# Patient Record
Sex: Female | Born: 1965 | Race: White | Hispanic: No | Smoking: Never smoker
Health system: Southern US, Community
[De-identification: ages and names within clinical notes are randomized; demographics above are authoritative.]

## PROBLEM LIST (undated history)

## (undated) DIAGNOSIS — R51 Headache: Secondary | ICD-10-CM

## (undated) DIAGNOSIS — T7840XA Allergy, unspecified, initial encounter: Secondary | ICD-10-CM

## (undated) DIAGNOSIS — I499 Cardiac arrhythmia, unspecified: Secondary | ICD-10-CM

## (undated) DIAGNOSIS — F419 Anxiety disorder, unspecified: Secondary | ICD-10-CM

## (undated) DIAGNOSIS — R519 Headache, unspecified: Secondary | ICD-10-CM

## (undated) DIAGNOSIS — N893 Dysplasia of vagina, unspecified: Secondary | ICD-10-CM

## (undated) DIAGNOSIS — I10 Essential (primary) hypertension: Secondary | ICD-10-CM

## (undated) DIAGNOSIS — K219 Gastro-esophageal reflux disease without esophagitis: Secondary | ICD-10-CM

## (undated) HISTORY — DX: Essential (primary) hypertension: I10

## (undated) HISTORY — DX: Gastro-esophageal reflux disease without esophagitis: K21.9

## (undated) HISTORY — DX: Anxiety disorder, unspecified: F41.9

## (undated) HISTORY — DX: Allergy, unspecified, initial encounter: T78.40XA

---

## 2000-08-28 ENCOUNTER — Emergency Department (HOSPITAL_COMMUNITY): Admission: EM | Admit: 2000-08-28 | Discharge: 2000-08-28 | Payer: Self-pay | Admitting: Emergency Medicine

## 2001-01-17 HISTORY — PX: CERVICAL BIOPSY  W/ LOOP ELECTRODE EXCISION: SUR135

## 2001-02-27 ENCOUNTER — Other Ambulatory Visit: Admission: RE | Admit: 2001-02-27 | Discharge: 2001-02-27 | Payer: Self-pay | Admitting: Obstetrics and Gynecology

## 2001-05-15 ENCOUNTER — Emergency Department (HOSPITAL_COMMUNITY): Admission: EM | Admit: 2001-05-15 | Discharge: 2001-05-16 | Payer: Self-pay | Admitting: Internal Medicine

## 2001-05-16 ENCOUNTER — Encounter: Payer: Self-pay | Admitting: Internal Medicine

## 2005-03-09 ENCOUNTER — Emergency Department: Payer: Self-pay | Admitting: Emergency Medicine

## 2005-03-09 ENCOUNTER — Other Ambulatory Visit: Payer: Self-pay

## 2005-03-10 ENCOUNTER — Ambulatory Visit: Payer: Self-pay | Admitting: Emergency Medicine

## 2007-06-18 ENCOUNTER — Emergency Department: Payer: Self-pay | Admitting: Emergency Medicine

## 2007-11-06 ENCOUNTER — Encounter: Admission: RE | Admit: 2007-11-06 | Discharge: 2007-11-06 | Payer: Self-pay | Admitting: Internal Medicine

## 2008-11-06 ENCOUNTER — Encounter: Admission: RE | Admit: 2008-11-06 | Discharge: 2008-11-06 | Payer: Self-pay | Admitting: General Practice

## 2014-01-17 HISTORY — PX: OTHER SURGICAL HISTORY: SHX169

## 2015-03-31 ENCOUNTER — Encounter: Payer: Self-pay | Admitting: Pediatrics

## 2015-03-31 ENCOUNTER — Ambulatory Visit (INDEPENDENT_AMBULATORY_CARE_PROVIDER_SITE_OTHER): Payer: BLUE CROSS/BLUE SHIELD | Admitting: Pediatrics

## 2015-03-31 VITALS — BP 122/80 | HR 70 | Temp 98.2°F | Resp 16 | Ht 64.37 in | Wt 170.4 lb

## 2015-03-31 DIAGNOSIS — K219 Gastro-esophageal reflux disease without esophagitis: Secondary | ICD-10-CM

## 2015-03-31 DIAGNOSIS — R062 Wheezing: Secondary | ICD-10-CM

## 2015-03-31 DIAGNOSIS — J301 Allergic rhinitis due to pollen: Secondary | ICD-10-CM | POA: Diagnosis not present

## 2015-03-31 MED ORDER — FLUTICASONE PROPIONATE 50 MCG/ACT NA SUSP: 2.0000 | Freq: Every day | NASAL | Status: DC

## 2015-03-31 NOTE — Progress Notes (Signed)
[place] [address] Dept: [phone]  New Patient Note  Patient ID: Joanne Ibarra, female    DOB: 1965/07/27  Age: 50 y.o. MRN: [id number] Date of Office Visit: 03/31/2015 Referring provider: Guadalupe Maple, MD No address on file    Chief Complaint: Allergies and Cough  HPI Joanne Ibarra presents for evaluation of nasal congestion since childhood. She has had a runny nose is stuffy nose and itchy eyes and itchy throat. Her symptoms are worse in the spring and fall of the year. She had the flu bronchitis 2 weeks ago and has had a cough since then. If she is exposed to cats and dogs , she has a stuffy nose.   Review of Systems  Constitutional: Negative.   HENT:       Nasal congestion since childhood  Eyes:       Itchy eyes at times  Respiratory:       Cough for 2 weeks following the flu  Cardiovascular:       Occasional palpitations. High blood pressure.  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   Neurological:       History of migraine headaches  Endo/Heme/Allergies:       Spring and fall allergies  Psychiatric/Behavioral: Negative.     Outpatient Encounter Prescriptions as of 03/31/2015  Medication Sig  . Calcium Carb-Cholecalciferol (CALTRATE 600+D3 SOFT PO) Take by mouth.  . hydrochlorothiazide (HYDRODIURIL) 25 MG tablet Take 25 mg by mouth daily.  . Multiple Vitamins-Minerals (CENTRUM PO) Take by mouth.  . Omeprazole 20 MG TBEC Take by mouth.  . fluticasone (FLONASE) 50 MCG/ACT nasal spray Place 2 sprays into both nostrils daily.  Marland Kitchen oseltamivir (TAMIFLU) 75 MG capsule Reported on 03/31/2015   No facility-administered encounter medications on file as of 03/31/2015.     Drug Allergies:  No Known Allergies  Family History: Joanne Ibarra's family history includes Asthma in her daughter and sister; Food Allergy in her sister.  There is no history of Allergic rhinitis, Angioedema, Eczema, Immunodeficiency, or Urticaria..  Social and environmental. There are no pets in the home. She is not exposed to cigarette smoking. She has not smoked cigarettes in the past. Her symptoms are not worse at work.  Physical Exam: BP 122/80 mmHg  Pulse 70  Temp(Src) 98.2 F (36.8 C) (Oral)  Resp 16  Ht 5' 4.37" (1.635 m)  Wt 170 lb 6.7 oz (77.3 kg)  BMI 28.92 kg/m2   Physical Exam  Constitutional: She is oriented to person, place, and time. She appears well-developed and well-nourished.  HENT:  Eyes normal. Ears normal. Nose moderate swelling of nasal turbinates with clear nasal discharge. Pharynx normal.  Neck: Neck supple. No thyromegaly present.  Cardiovascular:  S1 and S2 normal no murmurs  Pulmonary/Chest:  Clear to percussion and auscultation  Abdominal: Soft. There is no tenderness (no hepatosplenomegaly).  Lymphadenopathy:    She has no cervical adenopathy.  Neurological: She is alert and oriented to person, place, and time.  Skin:  Clear  Psychiatric: She has a normal mood and affect. Her behavior is normal. Judgment and thought content normal.  Vitals reviewed.   Diagnostics: FVC 3.20 L FEV1 2.66 L. Predicted FVC 3.56 L predicted FEV1 2.82 L. After albuterol 2 puffs FVC 3.36 L FEV1 2.79 L-the spirometry is  in the normal range and there was no significant improvement after albuterol  Allergy skin testing was very positive to grass pollen, weeds pollen, tree pollens, dust mite, cat, dog, cockroach, Slight reactivity noted to molds   Assessment Assessment and Plan: 1. Allergic rhinitis due to pollen   2. Wheezing   3. Gastroesophageal reflux disease without esophagitis     Meds ordered this encounter  Medications  . fluticasone (FLONASE) 50 MCG/ACT nasal spray    Sig: Place 2 sprays into both nostrils daily.    Dispense:  16 g    Refill:  5    For stuffy nose or drainage. Please keep rx on file pt.  Will call when needed    Patient Instructions  Environmental control of dust mite Zyrtec 10 mg once a day for runny nose Fluticasone 2 sprays per nostril once a day if needed for stuffy nose Opcon-A one drop 3 times a day if needed for itchy eyes Add  prednisone 10 mg twice a day for 4 days 10 mg on day 5 to bring your allergic symptoms under control Call me if you are not doing better on this treatment plan    Return in about 3 months (around 07/01/2015).   Thank you for the opportunity to care for this patient.  Please do not hesitate to contact me with questions.  Penne Lash, M.D.  [facility], [place] [address] [place], [place] [address] [phone]

## 2015-03-31 NOTE — Patient Instructions (Signed)
Environmental control of dust mite Zyrtec 10 mg once a day for runny nose Fluticasone 2 sprays per nostril once a day if needed for stuffy nose Opcon-A one drop 3 times a day if needed for itchy eyes Add  prednisone 10 mg twice a day for 4 days 10 mg on day 5 to bring your allergic symptoms under control Call me if you are not doing better on this treatment plan

## 2015-06-12 ENCOUNTER — Other Ambulatory Visit: Payer: Self-pay

## 2015-06-12 ENCOUNTER — Encounter: Payer: Self-pay | Admitting: *Deleted

## 2015-06-12 NOTE — Patient Instructions (Signed)
  Your procedure is scheduled on: 06-19-15 (FRIDAY) Report to [facility] To find out your arrival time please call [phone] between 1PM - 3PM on 06-18-15 (THURSDAY)  Remember: Instructions that are not followed completely may result in serious medical risk, up to and including death, or upon the discretion of your surgeon and anesthesiologist your surgery may need to be rescheduled.    _X___ 1. Do not eat food or drink liquids after midnight. No gum chewing or hard candies.     _X___ 2. No Alcohol for 24 hours before or after surgery.   ____ 3. Bring all medications with you on the day of surgery if instructed.    _X___ 4. Notify your doctor if there is any change in your medical condition     (cold, fever, infections).     Do not wear jewelry, make-up, hairpins, clips or nail polish.  Do not wear lotions, powders, or perfumes. You may wear deodorant.  Do not shave 48 hours prior to surgery. Men may shave face and neck.  Do not bring valuables to the hospital.    [facility] is not responsible for any belongings or valuables.               Contacts, dentures or bridgework may not be worn into surgery.  Leave your suitcase in the car. After surgery it may be brought to your room.  For patients admitted to the hospital, discharge time is determined by your treatment team.   Patients discharged the day of surgery will not be allowed to drive home.   Please read over the following fact sheets that you were given:    _X___ Take these medicines the morning of surgery with A SIP OF WATER:    1. OMEPRAZOLE (PRILOSEC)  2. TAKE AN EXTRA PRILOSEC ON Thursday NIGHT BEFORE BED  3.   4.  5.  6.  ____ Fleet Enema (as directed)   ____ Use CHG Soap as directed  ____ Use inhalers on the day of surgery  ____ Stop metformin 2 days prior to surgery    ____ Take 1/2 of usual insulin dose the night before surgery and none on the morning of surgery.   ____ Stop  Coumadin/Plavix/aspirin-N/A  _X___ Stop Anti-inflammatories-NO NSAIDS OR ASPIRIN PORDUCTS-TYLENOL OK TO TAKE   ____ Stop supplements until after surgery.    ____ Bring C-Pap to the hospital.

## 2015-06-17 ENCOUNTER — Encounter
Admission: RE | Admit: 2015-06-17 | Discharge: 2015-06-17 | Disposition: A | Discharge status: Home / Self Care | Payer: BLUE CROSS/BLUE SHIELD | Source: Ambulatory Visit | Attending: Obstetrics & Gynecology | Admitting: Obstetrics & Gynecology

## 2015-06-17 DIAGNOSIS — N870 Mild cervical dysplasia: Secondary | ICD-10-CM | POA: Diagnosis not present

## 2015-06-17 DIAGNOSIS — K219 Gastro-esophageal reflux disease without esophagitis: Secondary | ICD-10-CM | POA: Diagnosis not present

## 2015-06-17 DIAGNOSIS — G43909 Migraine, unspecified, not intractable, without status migrainosus: Secondary | ICD-10-CM | POA: Diagnosis not present

## 2015-06-17 DIAGNOSIS — Z808 Family history of malignant neoplasm of other organs or systems: Secondary | ICD-10-CM | POA: Diagnosis not present

## 2015-06-17 DIAGNOSIS — I10 Essential (primary) hypertension: Secondary | ICD-10-CM | POA: Diagnosis not present

## 2015-06-17 DIAGNOSIS — N950 Postmenopausal bleeding: Secondary | ICD-10-CM | POA: Diagnosis present

## 2015-06-17 DIAGNOSIS — Z803 Family history of malignant neoplasm of breast: Secondary | ICD-10-CM | POA: Diagnosis not present

## 2015-06-17 DIAGNOSIS — Z79899 Other long term (current) drug therapy: Secondary | ICD-10-CM | POA: Diagnosis not present

## 2015-06-17 LAB — ABO/RH: ABO/RH(D): O POS

## 2015-06-17 LAB — POTASSIUM: POTASSIUM: 3.8 mmol/L (ref 3.5–5.1)

## 2015-06-17 LAB — CBC
HCT: 41.1 % (ref 35.0–47.0)
HEMOGLOBIN: 14 g/dL (ref 12.0–16.0)
MCH: 29.8 pg (ref 26.0–34.0)
MCHC: 34 g/dL (ref 32.0–36.0)
MCV: 87.7 fL (ref 80.0–100.0)
Platelets: 250 10*3/uL (ref 150–440)
RBC: 4.68 MIL/uL (ref 3.80–5.20)
RDW: 14.3 % (ref 11.5–14.5)
WBC: 7 10*3/uL (ref 3.6–11.0)

## 2015-06-17 LAB — TYPE AND SCREEN
ABO/RH(D): O POS
ANTIBODY SCREEN: NEGATIVE

## 2015-06-19 ENCOUNTER — Ambulatory Visit: Payer: BLUE CROSS/BLUE SHIELD | Admitting: Anesthesiology

## 2015-06-19 ENCOUNTER — Encounter
Admission: RE | Disposition: A | Discharge status: Home / Self Care | Payer: Self-pay | Source: Ambulatory Visit | Attending: Obstetrics & Gynecology

## 2015-06-19 ENCOUNTER — Ambulatory Visit
Admission: RE | Admit: 2015-06-19 | Discharge: 2015-06-19 | Disposition: A | Discharge status: Home / Self Care | Payer: BLUE CROSS/BLUE SHIELD | Source: Ambulatory Visit | Attending: Obstetrics & Gynecology | Admitting: Obstetrics & Gynecology

## 2015-06-19 ENCOUNTER — Encounter: Payer: Self-pay | Admitting: *Deleted

## 2015-06-19 DIAGNOSIS — N950 Postmenopausal bleeding: Secondary | ICD-10-CM | POA: Insufficient documentation

## 2015-06-19 DIAGNOSIS — Z803 Family history of malignant neoplasm of breast: Secondary | ICD-10-CM | POA: Insufficient documentation

## 2015-06-19 DIAGNOSIS — N870 Mild cervical dysplasia: Secondary | ICD-10-CM | POA: Diagnosis not present

## 2015-06-19 DIAGNOSIS — Z79899 Other long term (current) drug therapy: Secondary | ICD-10-CM | POA: Insufficient documentation

## 2015-06-19 DIAGNOSIS — G43909 Migraine, unspecified, not intractable, without status migrainosus: Secondary | ICD-10-CM | POA: Insufficient documentation

## 2015-06-19 DIAGNOSIS — K219 Gastro-esophageal reflux disease without esophagitis: Secondary | ICD-10-CM | POA: Insufficient documentation

## 2015-06-19 DIAGNOSIS — Z808 Family history of malignant neoplasm of other organs or systems: Secondary | ICD-10-CM | POA: Insufficient documentation

## 2015-06-19 DIAGNOSIS — I10 Essential (primary) hypertension: Secondary | ICD-10-CM | POA: Insufficient documentation

## 2015-06-19 DIAGNOSIS — N879 Dysplasia of cervix uteri, unspecified: Secondary | ICD-10-CM | POA: Diagnosis present

## 2015-06-19 HISTORY — DX: Cardiac arrhythmia, unspecified: I49.9

## 2015-06-19 HISTORY — DX: Headache, unspecified: R51.9

## 2015-06-19 HISTORY — DX: Headache: R51

## 2015-06-19 HISTORY — PX: DILATATION & CURETTAGE/HYSTEROSCOPY WITH MYOSURE: SHX6511

## 2015-06-19 HISTORY — PX: LEEP: SHX91

## 2015-06-19 SURGERY — DILATATION & CURETTAGE/HYSTEROSCOPY WITH MYOSURE
Anesthesia: General | Wound class: Clean Contaminated

## 2015-06-19 MED ORDER — LACTATED RINGERS IV SOLN
INTRAVENOUS | Status: DC
  Administered 2015-06-19: 14:00:00 via INTRAVENOUS

## 2015-06-19 MED ORDER — FENTANYL CITRATE (PF) 100 MCG/2ML IJ SOLN
25.0000 ug | INTRAMUSCULAR | Status: DC | PRN
  Administered 2015-06-19: 50 ug via INTRAVENOUS

## 2015-06-19 MED ORDER — ACETAMINOPHEN 650 MG RE SUPP: 650.0000 mg | RECTAL | Status: DC | PRN

## 2015-06-19 MED ORDER — OXYCODONE-ACETAMINOPHEN 5-325 MG PO TABS
1.0000 | ORAL_TABLET | ORAL | Status: DC | PRN
  Administered 2015-06-19: 1 via ORAL

## 2015-06-19 MED ORDER — LIDOCAINE-EPINEPHRINE 1 %-1:100000 IJ SOLN
INTRAMUSCULAR | Status: AC
  Filled 2015-06-19: qty 1

## 2015-06-19 MED ORDER — ONDANSETRON HCL 4 MG/2ML IJ SOLN
INTRAMUSCULAR | Status: DC | PRN
  Administered 2015-06-19: 4 mg via INTRAVENOUS

## 2015-06-19 MED ORDER — SILVER NITRATE-POT NITRATE 75-25 % EX MISC
CUTANEOUS | Status: AC
  Filled 2015-06-19: qty 4

## 2015-06-19 MED ORDER — IODINE STRONG (LUGOLS) 5 % PO SOLN
ORAL | Status: AC
  Filled 2015-06-19: qty 1

## 2015-06-19 MED ORDER — FENTANYL CITRATE (PF) 100 MCG/2ML IJ SOLN
INTRAMUSCULAR | Status: AC
  Administered 2015-06-19: 50 ug via INTRAVENOUS
  Filled 2015-06-19: qty 2

## 2015-06-19 MED ORDER — MORPHINE SULFATE (PF) 2 MG/ML IV SOLN
1.0000 mg | INTRAVENOUS | Status: DC | PRN
Start: 2015-06-19 — End: 2015-06-19

## 2015-06-19 MED ORDER — OXYCODONE-ACETAMINOPHEN 5-325 MG PO TABS
ORAL_TABLET | ORAL | Status: DC
Start: 2015-06-19 — End: 2015-06-19
  Filled 2015-06-19: qty 1

## 2015-06-19 MED ORDER — ONDANSETRON HCL 4 MG/2ML IJ SOLN: 4.0000 mg | Freq: Once | INTRAMUSCULAR | Status: DC | PRN

## 2015-06-19 MED ORDER — ACETAMINOPHEN 325 MG PO TABS: 650.0000 mg | ORAL_TABLET | ORAL | Status: DC | PRN

## 2015-06-19 MED ORDER — PROPOFOL 10 MG/ML IV BOLUS
INTRAVENOUS | Status: DC | PRN
  Administered 2015-06-19: 150 mg via INTRAVENOUS

## 2015-06-19 MED ORDER — KETOROLAC TROMETHAMINE 30 MG/ML IJ SOLN: 30.0000 mg | Freq: Four times a day (QID) | INTRAMUSCULAR | Status: DC

## 2015-06-19 MED ORDER — DEXAMETHASONE SODIUM PHOSPHATE 10 MG/ML IJ SOLN
INTRAMUSCULAR | Status: DC | PRN
  Administered 2015-06-19: 5 mg via INTRAVENOUS

## 2015-06-19 MED ORDER — MIDAZOLAM HCL 2 MG/2ML IJ SOLN
INTRAMUSCULAR | Status: DC | PRN
  Administered 2015-06-19: 2 mg via INTRAVENOUS

## 2015-06-19 MED ORDER — KETOROLAC TROMETHAMINE 60 MG/2ML IM SOLN
INTRAMUSCULAR | Status: AC
  Administered 2015-06-19: 30 mg
  Filled 2015-06-19: qty 2

## 2015-06-19 MED ORDER — LIDOCAINE HCL (CARDIAC) 20 MG/ML IV SOLN
INTRAVENOUS | Status: DC | PRN
  Administered 2015-06-19: 100 mg via INTRAVENOUS

## 2015-06-19 MED ORDER — OXYCODONE-ACETAMINOPHEN 5-325 MG PO TABS: 1.0000 | ORAL_TABLET | ORAL | Status: DC | PRN

## 2015-06-19 MED ORDER — FERRIC SUBSULFATE 259 MG/GM EX SOLN
CUTANEOUS | Status: AC
  Filled 2015-06-19: qty 8

## 2015-06-19 MED ORDER — KETOROLAC TROMETHAMINE 60 MG/2ML IM SOLN
INTRAMUSCULAR | Status: AC
  Filled 2015-06-19: qty 2

## 2015-06-19 MED ORDER — FENTANYL CITRATE (PF) 100 MCG/2ML IJ SOLN
INTRAMUSCULAR | Status: DC | PRN
  Administered 2015-06-19 (×2): 50 ug via INTRAVENOUS

## 2015-06-19 SURGICAL SUPPLY — 44 items
ABLATOR ENDOMETRIAL MYOSURE (ABLATOR) ×3 IMPLANT
APPLICATOR COTTON TIP 6IN STRL (MISCELLANEOUS) ×3 IMPLANT
BAG COUNTER SPONGE EZ (MISCELLANEOUS) ×2 IMPLANT
BAG SPNG 4X4 CLR HAZ (MISCELLANEOUS) ×1
CANISTER SUC SOCK COL 7IN (MISCELLANEOUS) ×3 IMPLANT
CANISTER SUCT 1200ML W/VALVE (MISCELLANEOUS) ×3 IMPLANT
CATH ROBINSON RED A/P 16FR (CATHETERS) ×3 IMPLANT
CNTNR SPEC 2.5X3XGRAD LEK (MISCELLANEOUS) ×2
CONT SPEC 4OZ STER OR WHT (MISCELLANEOUS) ×4
CONT SPEC 4OZ STRL OR WHT (MISCELLANEOUS) ×2
CONTAINER SPEC 2.5X3XGRAD LEK (MISCELLANEOUS) ×2 IMPLANT
COUNTER SPONGE BAG EZ (MISCELLANEOUS) ×1
ELECT LEEP BALL 5MM 12CM (MISCELLANEOUS) ×3
ELECT LEEP LOOP 20X10 R2010 (MISCELLANEOUS) ×3
ELECT LOOP 1.0X1.0CM R1010 (MISCELLANEOUS) ×3
ELECT REM PT RETURN 9FT ADLT (ELECTROSURGICAL) ×3
ELECTRODE LEEP BALL 5MM 12CM (MISCELLANEOUS) ×1 IMPLANT
ELECTRODE LEP LOOP 20X10 R2010 (MISCELLANEOUS) ×1 IMPLANT
ELECTRODE LOOP 1.0X1.0CM R1010 (MISCELLANEOUS) ×1 IMPLANT
ELECTRODE REM PT RTRN 9FT ADLT (ELECTROSURGICAL) ×1 IMPLANT
GLOVE BIO SURGEON STRL SZ8 (GLOVE) ×3 IMPLANT
GOWN STRL REUS W/ TWL LRG LVL3 (GOWN DISPOSABLE) ×1 IMPLANT
GOWN STRL REUS W/ TWL XL LVL3 (GOWN DISPOSABLE) ×1 IMPLANT
GOWN STRL REUS W/TWL LRG LVL3 (GOWN DISPOSABLE) ×3
GOWN STRL REUS W/TWL XL LVL3 (GOWN DISPOSABLE) ×3
HANDLE YANKAUER SUCT BULB TIP (MISCELLANEOUS) ×2 IMPLANT
MYOSURE LITE POLYP REMOVAL (MISCELLANEOUS) ×3 IMPLANT
NDL SPNL 22GX3.5 QUINCKE BK (NEEDLE) ×1 IMPLANT
NEEDLE SPNL 22GX3.5 QUINCKE BK (NEEDLE) ×3 IMPLANT
NS IRRIG 500ML POUR BTL (IV SOLUTION) ×3 IMPLANT
PACK DNC HYST (MISCELLANEOUS) ×3 IMPLANT
PAD OB MATERNITY 4.3X12.25 (PERSONAL CARE ITEMS) ×3 IMPLANT
PAD PREP 24X41 OB/GYN DISP (PERSONAL CARE ITEMS) ×3 IMPLANT
PENCIL ELECTRO HAND CTR (MISCELLANEOUS) ×3 IMPLANT
SOL .9 NS 3000ML IRR  AL (IV SOLUTION) ×2
SOL .9 NS 3000ML IRR AL (IV SOLUTION) ×1
SOL .9 NS 3000ML IRR UROMATIC (IV SOLUTION) ×1 IMPLANT
STRAP SAFETY BODY (MISCELLANEOUS) ×3 IMPLANT
STRAW SMOKE EVAC LEEP 6150 NON (MISCELLANEOUS) ×3 IMPLANT
SYR CONTROL 10ML (SYRINGE) ×3 IMPLANT
TOWEL OR 17X26 4PK STRL BLUE (TOWEL DISPOSABLE) ×3 IMPLANT
TUBING CONNECTING 10 (TUBING) ×2 IMPLANT
TUBING CONNECTING 10' (TUBING) ×1
TUBING HYSTEROSCOPY DOLPHIN (MISCELLANEOUS) ×3 IMPLANT

## 2015-06-19 NOTE — H&P (Signed)
History and Physical Interval Note:  06/19/2015 2:16 PM  Joanne Ibarra  has presented today for surgery, with the diagnosis of Frederica  The various methods of treatment have been discussed with the patient and family. After consideration of risks, benefits and other options for treatment, the patient has consented to  Procedure(s): DILATATION & CURETTAGE/HYSTEROSCOPY WITH MYOSURE (N/A) LOOP ELECTROSURGICAL EXCISION PROCEDURE (LEEP) (N/A) as a surgical intervention .  The patient's history has been reviewed, patient examined, no change in status, stable for surgery.  Pt has the following beta blocker history-  Not taking Beta Blocker.  I have reviewed the patient's chart and labs.  Questions were answered to the patient's satisfaction.    Hoyt Koch

## 2015-06-19 NOTE — Op Note (Signed)
Operative Note   SURGERY DATE: 06/19/2015  PRE-OP DIAGNOSIS: Cervical Dysplasia and Postmenopausal Bleeding  POST-OP DIAGNOSIS: same  PROCEDURE: Exam under anesthesia, loop electricosurgical excisional procedure, endocervical curettage, Hysteroscopy dilation and curettage  SURGEON: Barnett Applebaum, MD, FACOG  ANESTHESIA: Choice   ESTIMATED BLOOD LOSS: Minimal   SPECIMENS: @ORSPECIMEN @   COMPLICATIONS: None  INDICATIONS: PMB, CIN I recurrent  FINDINGS: EGBUS and vagina within normal limits. Cervix grossly normal appearing.  No polyps in uterus.    PROCEDURE IN DETAIL: After informed consent was obtained, the patient was taken to the operating room where general anesthesia was administered without difficulty. The patient was positioned in the dorsal lithotomy position in [place]. Time-out was performed. The patient was examined under anesthesia, and the above findings were noted. She was prepped and draped in normal sterile fashion. The patient's bladder was cathaterized with an in and out catheter.  The weightedspeculum was placed inside the patient's vagina, and the the anterior lip of the cervix was seen and grasped with the tenaculum.  An Endocervical specimen was obtained with a kevorkian curette. The uterine cavity was sounded to 7cm, and then the cervix was progressively dilated to a 16French-Pratt dilator. The 30 degree hysteroscope was introduced, with saline fluid used to distend the intrauterine cavity, with the above noted findings.  The hystersocope was removed and the uterine cavity was curetted until a gritty texture was noted, yielding endometrial curettings. Excellent hemostasis was noted, and all instruments were removed, with excellent hemostasis noted throughout. Minimal discrepancy in fluid was noted.  Using the LEEP device and loops for ecto- and endo- cervical specimens,  the conization was performed, making sure to encompass the entire lesion and transformation  zone and to take a deeper second specimen. The cone bed was then cauterized to achieve hemostasis. Speculum was removed and patient awoken from anesthesia.  The patient tolerated the procedure well. Sponge, lap, needle, and instrument counts were correct x 2. The patient was transferred to the recovery room awake, alert and breathing independently in stable condition.

## 2015-06-19 NOTE — Transfer of Care (Signed)
Immediate Anesthesia Transfer of Care Note  Patient: Joanne Ibarra  Procedure(s) Performed: Procedure(s): DILATATION & CURETTAGE/ HYSTEROSCOPY  (N/A) LOOP ELECTROSURGICAL EXCISION PROCEDURE (LEEP) (N/A)  Patient Location: PACU  Anesthesia Type:General  Level of Consciousness: sedated  Airway & Oxygen Therapy: Patient Spontanous Breathing and Patient connected to face mask oxygen  Post-op Assessment: Post -op Vital signs reviewed and stable  Post vital signs: stable  Last Vitals:  Filed Vitals:   06/19/15 1302 06/19/15 1516  BP: 135/91 98/67  Pulse: 68 59  Temp: 36.3 C 36.8 C  Resp: 16 13    Last Pain: There were no vitals filed for this visit.       Complications: No apparent anesthesia complications

## 2015-06-19 NOTE — Discharge Instructions (Signed)
Hysteroscopy, Care After Refer to this sheet in the next few weeks. These instructions provide you with information on caring for yourself after your procedure. Your health care provider may also give you more specific instructions. Your treatment has been planned according to current medical practices, but problems sometimes occur. Call your health care provider if you have any problems or questions after your procedure.  WHAT TO EXPECT AFTER THE PROCEDURE After your procedure, it is typical to have the following:  You may have some cramping. This normally lasts for a couple days.  You may have bleeding. This can vary from light spotting for a few days to menstrual-like bleeding for 3-7 days. HOME CARE INSTRUCTIONS  Rest for the first 1-2 days after the procedure.  Only take over-the-counter or prescription medicines as directed by your health care provider. Do not take aspirin. It can increase the chances of bleeding.  Take showers instead of baths for 2 weeks or as directed by your health care provider.  Do not drive for 24 hours or as directed.  Do not drink alcohol while taking pain medicine.  Do not use tampons, douche, or have sexual intercourse for 2 weeks or until your health care provider says it is okay.  Take your temperature twice a day for 4-5 days. Write it down each time.  Follow your health care provider's advice about diet, exercise, and lifting.  If you develop constipation, you may:  Take a mild laxative if your health care provider approves.  Add bran foods to your diet.  Drink enough fluids to keep your urine clear or pale yellow.  Try to have someone with you or available to you for the first 24-48 hours, especially if you were given a general anesthetic.  Follow up with your health care provider as directed. SEEK MEDICAL CARE IF:  You feel dizzy or lightheaded.  You feel sick to your stomach (nauseous).  You have abnormal vaginal discharge.  You  have a rash.  You have pain that is not controlled with medicine. SEEK IMMEDIATE MEDICAL CARE IF:  You have bleeding that is heavier than a normal menstrual period.  You have a fever.  You have increasing cramps or pain, not controlled with medicine.  You have new belly (abdominal) pain.  You pass out.  You have pain in the tops of your shoulders (shoulder strap areas).  You have shortness of breath.   This information is not intended to replace advice given to you by your health care provider. Make sure you discuss any questions you have with your health care provider.   Document Released: 10/24/2012 Document Reviewed: 10/24/2012 Elsevier Interactive Patient Education 2016 [place]   1) The drugs that you were given will stay in your system until tomorrow so for the next 24 hours you should not:  A) Drive an automobile B) Make any legal decisions C) Drink any alcoholic beverage   2) You may resume regular meals tomorrow.  Today it is better to start with liquids and gradually work up to solid foods.  You may eat anything you prefer, but it is better to start with liquids, then soup and crackers, and gradually work up to solid foods.   3) Please notify your doctor immediately if you have any unusual bleeding, trouble breathing, redness and pain at the surgery site, drainage, fever, or pain not relieved by medication.    4) Additional Instructions:  Additional Instructions: ° ° ° ° ° ° ° °Please contact your physician with any problems or Same Day Surgery at [phone], Monday through Friday 6 am to 4 pm, or [facility] [place] Main number at [phone]. ° ° ° ° °

## 2015-06-19 NOTE — Anesthesia Procedure Notes (Signed)
Procedure Name: LMA Insertion Date/Time: 06/19/2015 2:37 PM Performed by: Aline Brochure Pre-anesthesia Checklist: Patient identified, Emergency Drugs available, Suction available and Patient being monitored Patient Re-evaluated:Patient Re-evaluated prior to inductionOxygen Delivery Method: Circle system utilized Preoxygenation: Pre-oxygenation with 100% oxygen Intubation Type: IV induction Ventilation: Mask ventilation without difficulty LMA: LMA inserted LMA Size: 3.5 Number of attempts: 1 Placement Confirmation: positive ETCO2 and breath sounds checked- equal and bilateral Tube secured with: Tape Dental Injury: Teeth and Oropharynx as per pre-operative assessment

## 2015-06-19 NOTE — Anesthesia Preprocedure Evaluation (Signed)
Anesthesia Evaluation  Patient identified by MRN, date of birth, ID band Patient awake    Reviewed: Allergy & Precautions, H&P , NPO status , Patient's Chart, lab work & pertinent test results, reviewed documented beta blocker date and time   History of Anesthesia Complications (+) history of anesthetic complications  Airway Mallampati: III  TM Distance: >3 FB Neck ROM: full    Dental no notable dental hx. (+) Teeth Intact, Caps   Pulmonary neg pulmonary ROS,    Pulmonary exam normal breath sounds clear to auscultation       Cardiovascular Exercise Tolerance: Good hypertension, (-) angina(-) CAD, (-) Past MI, (-) Cardiac Stents and (-) CABG Normal cardiovascular exam+ dysrhythmias (palpitations) + Valvular Problems/Murmurs (as a child, resolved)  Rhythm:regular Rate:Normal     Neuro/Psych negative neurological ROS  negative psych ROS   GI/Hepatic Neg liver ROS, GERD  ,  Endo/Other  negative endocrine ROS  Renal/GU CRFRenal disease  negative genitourinary   Musculoskeletal   Abdominal   Peds  Hematology negative hematology ROS (+)   Anesthesia Other Findings Past Medical History:   Hypertension                                                 GERD (gastroesophageal reflux disease)                       Dysrhythmia                                                    Comment:PALPITATIONS YEARS AGO-NO CURRENT PROBLEMS   Chronic kidney disease                                         Comment:H/O STONES   Headache                                                       Comment:H/O MIGRAINES   Reproductive/Obstetrics negative OB ROS                             Anesthesia Physical Anesthesia Plan  ASA: II  Anesthesia Plan: General   Post-op Pain Management:    Induction:   Airway Management Planned:   Additional Equipment:   Intra-op Plan:   Post-operative Plan:   Informed Consent:  I have reviewed the patients History and Physical, chart, labs and discussed the procedure including the risks, benefits and alternatives for the proposed anesthesia with the patient or authorized representative who has indicated his/her understanding and acceptance.   Dental Advisory Given  Plan Discussed with: Anesthesiologist, CRNA and Surgeon  Anesthesia Plan Comments:         Anesthesia Quick Evaluation

## 2015-06-22 ENCOUNTER — Encounter: Payer: Self-pay | Admitting: Obstetrics & Gynecology

## 2015-06-22 NOTE — Anesthesia Postprocedure Evaluation (Signed)
Anesthesia Post Note  Patient: Joanne Ibarra  Procedure(s) Performed: Procedure(s) (LRB): DILATATION & CURETTAGE/ HYSTEROSCOPY  (N/A) LOOP ELECTROSURGICAL EXCISION PROCEDURE (LEEP) (N/A)  Patient location during evaluation: PACU Anesthesia Type: General Level of consciousness: awake and alert Pain management: pain level controlled Vital Signs Assessment: post-procedure vital signs reviewed and stable Respiratory status: spontaneous breathing, nonlabored ventilation, respiratory function stable and patient connected to nasal cannula oxygen Cardiovascular status: blood pressure returned to baseline and stable Postop Assessment: no signs of nausea or vomiting Anesthetic complications: no    Last Vitals:  Filed Vitals:   06/19/15 1616 06/19/15 1645  BP: 139/76 123/76  Pulse: 55 50  Temp: 36.6 C   Resp: 12 14    Last Pain:  Filed Vitals:   06/19/15 1647  PainSc: 5                  Martha Clan

## 2015-06-24 LAB — SURGICAL PATHOLOGY

## 2015-07-07 ENCOUNTER — Ambulatory Visit: Payer: BLUE CROSS/BLUE SHIELD | Admitting: Pediatrics

## 2016-06-01 ENCOUNTER — Ambulatory Visit (INDEPENDENT_AMBULATORY_CARE_PROVIDER_SITE_OTHER): Payer: Managed Care, Other (non HMO) | Admitting: Obstetrics & Gynecology

## 2016-06-01 ENCOUNTER — Encounter: Payer: Self-pay | Admitting: Obstetrics & Gynecology

## 2016-06-01 VITALS — BP 100/70 | HR 87 | Ht 64.0 in | Wt 155.0 lb

## 2016-06-01 DIAGNOSIS — Z87410 Personal history of cervical dysplasia: Secondary | ICD-10-CM

## 2016-06-01 NOTE — Progress Notes (Signed)
  HPI:  Patient is a 51 y.o.  presenting for follow up evaluation of abnormal PAP smear in the past.  Her last PAP was high grade with LEEP last year (CIN I in LEEP specimen). No new sx's.  No VB.  Menopausal.  PMHx: She  has a past medical history of Chronic kidney disease; Dysrhythmia; GERD (gastroesophageal reflux disease); Headache; and Hypertension. Also,  has a past surgical history that includes Cervical biopsy w/ loop electrode excision (2003); lensectomy (Bilateral, 2016); Dilatation & curettage/hysteroscopy with myosure (N/A, 06/19/2015); and LEEP (N/A, 06/19/2015)., family history includes Asthma in her daughter and sister; Food Allergy in her sister.,  reports that she has never smoked. She has never used smokeless tobacco. She reports that she drinks alcohol. She reports that she does not use drugs.  She has a current medication list which includes the following prescription(s): hydrochlorothiazide, omeprazole, calcium carb-cholecalciferol, fluticasone, multiple vitamins-minerals, and oxycodone-acetaminophen. Also, has No Known Allergies.  Review of Systems  Constitutional: Negative for chills, fever and malaise/fatigue.  HENT: Negative for congestion, sinus pain and sore throat.   Eyes: Negative for blurred vision and pain.  Respiratory: Negative for cough and wheezing.   Cardiovascular: Negative for chest pain and leg swelling.  Gastrointestinal: Negative for abdominal pain, constipation, diarrhea, heartburn, nausea and vomiting.  Genitourinary: Negative for dysuria, frequency, hematuria and urgency.  Musculoskeletal: Negative for back pain, joint pain, myalgias and neck pain.  Skin: Negative for itching and rash.  Neurological: Negative for dizziness, tremors and weakness.  Endo/Heme/Allergies: Does not bruise/bleed easily.  Psychiatric/Behavioral: Negative for depression. The patient is not nervous/anxious and does not have insomnia.     Objective: BP 100/70   Pulse 87   Ht 5'  4" (1.626 m)   Wt 155 lb (70.3 kg)   BMI 26.61 kg/m  Filed Weights   06/01/16 1403  Weight: 155 lb (70.3 kg)   Body mass index is 26.61 kg/m.  Physical examination Physical Exam  Constitutional: She is oriented to person, place, and time. She appears well-developed and well-nourished. No distress.  Genitourinary: Vagina normal and uterus normal. Pelvic exam was performed with patient supine. There is no rash, tenderness or lesion on the right labia. There is no rash, tenderness or lesion on the left labia. No erythema or bleeding in the vagina. Right adnexum does not display mass and does not display tenderness. Left adnexum does not display mass and does not display tenderness. Cervix does not exhibit motion tenderness, discharge, polyp or nabothian cyst.   Uterus is mobile and midaxial. Uterus is not enlarged or exhibiting a mass.  Abdominal: Soft. She exhibits no distension. There is no tenderness.  Musculoskeletal: Normal range of motion.  Neurological: She is alert and oriented to person, place, and time. No cranial nerve deficit.  Skin: Skin is warm and dry.  Psychiatric: She has a normal mood and affect.    ASSESSMENT:  History of Cervical Dysplasia  Plan:  1.  I discussed the grading system of pap smears and HPV high risk viral types.   2. Follow up PAP 6 months, vs intervention if high grade dysplasia identified. 3. Cryo, repeat LEEP, or hysterectomy as options for persistant dysplasia.   Barnett Applebaum, MD, [facility], [facility] 06/01/2016  2:35 PM

## 2016-06-07 LAB — IGP, APTIMA HPV
HPV APTIMA: NEGATIVE
PAP Smear Comment: 0

## 2016-11-21 DIAGNOSIS — Z1231 Encounter for screening mammogram for malignant neoplasm of breast: Secondary | ICD-10-CM | POA: Diagnosis not present

## 2016-12-21 DIAGNOSIS — Z13220 Encounter for screening for lipoid disorders: Secondary | ICD-10-CM | POA: Diagnosis not present

## 2016-12-21 DIAGNOSIS — Z0000 Encounter for general adult medical examination without abnormal findings: Secondary | ICD-10-CM | POA: Diagnosis not present

## 2016-12-21 DIAGNOSIS — Z136 Encounter for screening for cardiovascular disorders: Secondary | ICD-10-CM | POA: Diagnosis not present

## 2016-12-21 DIAGNOSIS — I10 Essential (primary) hypertension: Secondary | ICD-10-CM | POA: Diagnosis not present

## 2016-12-21 DIAGNOSIS — Z131 Encounter for screening for diabetes mellitus: Secondary | ICD-10-CM | POA: Diagnosis not present

## 2017-04-17 DIAGNOSIS — H16141 Punctate keratitis, right eye: Secondary | ICD-10-CM | POA: Diagnosis not present

## 2017-04-17 DIAGNOSIS — S0501XA Injury of conjunctiva and corneal abrasion without foreign body, right eye, initial encounter: Secondary | ICD-10-CM | POA: Diagnosis not present

## 2017-04-17 DIAGNOSIS — Z961 Presence of intraocular lens: Secondary | ICD-10-CM | POA: Diagnosis not present

## 2017-04-19 DIAGNOSIS — S0501XD Injury of conjunctiva and corneal abrasion without foreign body, right eye, subsequent encounter: Secondary | ICD-10-CM | POA: Diagnosis not present

## 2017-04-19 DIAGNOSIS — H16423 Pannus (corneal), bilateral: Secondary | ICD-10-CM | POA: Diagnosis not present

## 2017-10-05 DIAGNOSIS — Z23 Encounter for immunization: Secondary | ICD-10-CM | POA: Diagnosis not present

## 2017-11-03 ENCOUNTER — Ambulatory Visit (INDEPENDENT_AMBULATORY_CARE_PROVIDER_SITE_OTHER): Payer: 59 | Admitting: Medical

## 2017-11-03 ENCOUNTER — Encounter: Payer: Self-pay | Admitting: Medical

## 2017-11-03 ENCOUNTER — Ambulatory Visit: Payer: BLUE CROSS/BLUE SHIELD | Admitting: Medical

## 2017-11-03 VITALS — BP 128/82 | HR 69 | Temp 98.4°F | Resp 16 | Ht 64.0 in | Wt 162.8 lb

## 2017-11-03 DIAGNOSIS — F419 Anxiety disorder, unspecified: Secondary | ICD-10-CM | POA: Diagnosis not present

## 2017-11-03 DIAGNOSIS — R5383 Other fatigue: Secondary | ICD-10-CM | POA: Diagnosis not present

## 2017-11-03 DIAGNOSIS — R69 Illness, unspecified: Secondary | ICD-10-CM | POA: Diagnosis not present

## 2017-11-03 DIAGNOSIS — I10 Essential (primary) hypertension: Secondary | ICD-10-CM

## 2017-11-03 MED ORDER — BUSPIRONE HCL 7.5 MG PO TABS: 7.5000 mg | ORAL_TABLET | Freq: Two times a day (BID) | ORAL | 0 refills | Status: DC

## 2017-11-03 MED ORDER — VENLAFAXINE HCL ER 37.5 MG PO CP24: 37.5000 mg | ORAL_CAPSULE | Freq: Every day | ORAL | 0 refills | Status: DC

## 2017-11-03 MED ORDER — HYDROCHLOROTHIAZIDE 25 MG PO TABS: 25.0000 mg | ORAL_TABLET | Freq: Every day | ORAL | 11 refills | Status: DC

## 2017-11-03 NOTE — Progress Notes (Signed)
Subjective:    Patient ID: Joanne Ibarra, female    DOB: [birth year], 52 y.o.   MRN: [id number]  HPI  Pt in for first time. Lived in [place] until July. Pt lives with 52 yo. Separated from husband.  Pt works at hospice for 3 years.She works out 4-5 times a week. She does not drink a lot of caffeine beverages. Nonsmoker. Eats healthy.  Pt has htn in past. She has been on hctz 25 mg in the past. Just recently she has been a lot of stress. Stress and anxious constantly. Her daughter is in legal trouble. This in combination with separation causing constant stress and anxiety. She states can't shut her brain off. This past week her blood pressure has been in 150/high 90 range consistently.   She thinks high bp at time related to stress. Today bp 128/82.  Some intermittent insomnia at times. No hx of psychiatric med use in the past.     Review of Systems  Constitutional: Positive for fatigue. Negative for chills and fever.       Mild fatigue since has been stressed.  HENT: Negative for congestion and drooling.   Respiratory: Negative for cough, chest tightness, shortness of breath and wheezing.   Cardiovascular: Negative for chest pain and palpitations.  Gastrointestinal: Negative for abdominal distention and abdominal pain.  Musculoskeletal: Negative for back pain, joint swelling and neck pain.  Skin: Negative for rash.  Neurological: Negative for dizziness, syncope, speech difficulty, weakness and headaches.  Hematological: Negative for adenopathy. Does not bruise/bleed easily.  Psychiatric/Behavioral: Negative for behavioral problems and decreased concentration. The patient is not nervous/anxious.     Past Medical History:  Diagnosis Date  . Chronic kidney disease    H/O STONES  . Dysrhythmia    PALPITATIONS YEARS AGO-NO CURRENT PROBLEMS  . GERD (gastroesophageal reflux disease)   . Headache    H/O MIGRAINES  . Hypertension      Social History   Socioeconomic  History  . Marital status: Legally Separated    Spouse name: Not on file  . Number of children: Not on file  . Years of education: Not on file  . Highest education level: Not on file  Occupational History  . Not on file  Social Needs  . Financial resource strain: Not on file  . Food insecurity:    Worry: Not on file    Inability: Not on file  . Transportation needs:    Medical: Not on file    Non-medical: Not on file  Tobacco Use  . Smoking status: Never Smoker  . Smokeless tobacco: Never Used  Substance and Sexual Activity  . Alcohol use: Yes    Comment: OCC  . Drug use: No  . Sexual activity: Yes  Lifestyle  . Physical activity:    Days per week: Not on file    Minutes per session: Not on file  . Stress: Not on file  Relationships  . Social connections:    Talks on phone: Not on file    Gets together: Not on file    Attends religious service: Not on file    Active member of club or organization: Not on file    Attends meetings of clubs or organizations: Not on file    Relationship status: Not on file  . Intimate partner violence:    Fear of current or ex partner: Not on file    Emotionally abused: Not on file    Physically abused: Not  on file    Forced sexual activity: Not on file  Other Topics Concern  . Not on file  Social History Narrative  . Not on file    Past Surgical History:  Procedure Laterality Date  . CERVICAL BIOPSY  W/ LOOP ELECTRODE EXCISION  2003  . DILATATION & CURETTAGE/HYSTEROSCOPY WITH MYOSURE N/A 06/19/2015   Procedure: DILATATION & CURETTAGE/ HYSTEROSCOPY ;  Surgeon: Gae Dry, MD;  Location: [facility];  Service: Gynecology;  Laterality: N/A;  . LEEP N/A 06/19/2015   Procedure: LOOP ELECTROSURGICAL EXCISION PROCEDURE (LEEP);  Surgeon: Gae Dry, MD;  Location: [facility];  Service: Gynecology;  Laterality: N/A;  . lensectomy Bilateral 2016    Family History  Problem Relation Age of Onset  . Asthma Sister   . Food Allergy Sister    . Asthma Daughter   . Allergic rhinitis Neg Hx   . Angioedema Neg Hx   . Eczema Neg Hx   . Immunodeficiency Neg Hx   . Urticaria Neg Hx     No Known Allergies  Current Outpatient Medications on File Prior to Visit  Medication Sig Dispense Refill  . hydrochlorothiazide (HYDRODIURIL) 25 MG tablet Take 25 mg by mouth daily.  1  . Multiple Vitamins-Minerals (CENTRUM PO) Take 1 tablet by mouth daily.      No current facility-administered medications on file prior to visit.     BP 128/82   Pulse 69   Temp 98.4 F (36.9 C) (Oral)   Resp 16   Ht 5\' 4"  (1.626 m)   Wt 162 lb 12.5 oz (73.8 kg)   SpO2 99%   BMI 27.94 kg/m       Objective:   Physical Exam  General Mental Status- Alert. General Appearance- Not in acute distress.    Chest and Lung Exam Auscultation: Breath Sounds:-Normal.  Cardiovascular Auscultation:Rythm- Regular. Murmurs & Other Heart Sounds:Auscultation of the heart reveals- No Murmurs.    Neurologic Cranial Nerve exam:- CN III-XII intact(No nystagmus), symmetric smile. Strength:- 5/5 equal and symmetric strength both upper and lower extremities.     Assessment & Plan:  Your blood pressure is adequately controlled today but you describe high readings over the last week when you are very stressed.  Would continue HCTZ currently at 25 mg dose.  I sent in refills.  Keep checking blood pressures and make sure levels controlled.  For anxiety and stress, I did send Effexor 37.5 mg tablet to your pharmacy.  Take 1 tablet daily.  It may take  2 weeks to feel full effects of medication.  Go ahead and start BuSpar 1 tablet twice daily for anxiety.  Once Effexor takes full effect then you could try using buspar only as needed for severe anxiety.  For fatigue, we will get CMP, CBC, B12, B1, TSH and vitamin D.  Fatigue might be related to your high level of stress but will rule out any potential diagnoses that could be contributing factor.  Follow-up in 2 weeks  or as needed.  Mackie Pai, PA-C

## 2017-11-03 NOTE — Patient Instructions (Addendum)
Your blood pressure is adequately controlled today but you describe high readings over the last week when you are very stressed.  Would continue HCTZ currently at 25 mg dose.  I sent in refills.  Keep checking blood pressures and make sure levels controlled.  For anxiety and stress, I did send Effexor 37.5 mg tablet to your pharmacy.  Take 1 tablet daily.  It may take  2 weeks to feel full effects of medication.  Go ahead and start BuSpar 1 tablet twice daily for anxiety.  Once Effexor takes full effect then you could try using buspar  as needed for severe anxiety.  For fatigue, we will get CMP, CBC, B12, B1, TSH and vitamin D.  Fatigue might be related to your high level of stress but will rule out any potential diagnoses that could be contributing factor.  Follow-up in 2 weeks or as needed.

## 2017-11-05 ENCOUNTER — Telehealth: Payer: Self-pay | Admitting: Medical

## 2017-11-05 MED ORDER — VITAMIN D (ERGOCALCIFEROL) 1.25 MG (50000 UNIT) PO CAPS: 50000.0000 [IU] | ORAL_CAPSULE | ORAL | 0 refills | Status: DC

## 2017-11-05 NOTE — Telephone Encounter (Signed)
Rx vit D sent to pt pharmacy. 

## 2017-11-07 LAB — CBC WITH DIFFERENTIAL/PLATELET
BASOS ABS: 38 {cells}/uL (ref 0–200)
Basophils Relative: 0.6 %
EOS ABS: 101 {cells}/uL (ref 15–500)
EOS PCT: 1.6 %
HEMATOCRIT: 41.8 % (ref 35.0–45.0)
Hemoglobin: 14.5 g/dL (ref 11.7–15.5)
LYMPHS ABS: 2407 {cells}/uL (ref 850–3900)
MCH: 30 pg (ref 27.0–33.0)
MCHC: 34.7 g/dL (ref 32.0–36.0)
MCV: 86.4 fL (ref 80.0–100.0)
MONOS PCT: 11.3 %
MPV: 10.6 fL (ref 7.5–12.5)
NEUTROS PCT: 48.3 %
Neutro Abs: 3043 cells/uL (ref 1500–7800)
PLATELETS: 304 10*3/uL (ref 140–400)
RBC: 4.84 10*6/uL (ref 3.80–5.10)
RDW: 12.7 % (ref 11.0–15.0)
Total Lymphocyte: 38.2 %
WBC mixed population: 712 cells/uL (ref 200–950)
WBC: 6.3 10*3/uL (ref 3.8–10.8)

## 2017-11-07 LAB — COMPREHENSIVE METABOLIC PANEL
AG RATIO: 1.5 (calc) (ref 1.0–2.5)
ALBUMIN MSPROF: 4.1 g/dL (ref 3.6–5.1)
ALT: 11 U/L (ref 6–29)
AST: 17 U/L (ref 10–35)
Alkaline phosphatase (APISO): 84 U/L (ref 33–130)
BILIRUBIN TOTAL: 0.2 mg/dL (ref 0.2–1.2)
BUN: 16 mg/dL (ref 7–25)
CALCIUM: 9.4 mg/dL (ref 8.6–10.4)
CO2: 30 mmol/L (ref 20–32)
Chloride: 102 mmol/L (ref 98–110)
Creat: 0.96 mg/dL (ref 0.50–1.05)
Globulin: 2.7 g/dL (calc) (ref 1.9–3.7)
Glucose, Bld: 77 mg/dL (ref 65–99)
POTASSIUM: 3.4 mmol/L — AB (ref 3.5–5.3)
SODIUM: 142 mmol/L (ref 135–146)
TOTAL PROTEIN: 6.8 g/dL (ref 6.1–8.1)

## 2017-11-07 LAB — TSH: TSH: 1.05 mIU/L

## 2017-11-07 LAB — VITAMIN B1: Vitamin B1 (Thiamine): 12 nmol/L (ref 8–30)

## 2017-11-07 LAB — VITAMIN B12: VITAMIN B 12: 595 pg/mL (ref 200–1100)

## 2017-11-07 LAB — VITAMIN D 25 HYDROXY (VIT D DEFICIENCY, FRACTURES): VIT D 25 HYDROXY: 29 ng/mL — AB (ref 30–100)

## 2017-11-17 ENCOUNTER — Encounter: Payer: Self-pay | Admitting: Medical

## 2017-11-17 ENCOUNTER — Ambulatory Visit (INDEPENDENT_AMBULATORY_CARE_PROVIDER_SITE_OTHER): Payer: 59 | Admitting: Medical

## 2017-11-17 VITALS — BP 125/78 | HR 74 | Temp 98.3°F | Resp 16 | Ht 64.0 in | Wt 162.8 lb

## 2017-11-17 DIAGNOSIS — I10 Essential (primary) hypertension: Secondary | ICD-10-CM

## 2017-11-17 DIAGNOSIS — T148XXA Other injury of unspecified body region, initial encounter: Secondary | ICD-10-CM | POA: Diagnosis not present

## 2017-11-17 DIAGNOSIS — R7989 Other specified abnormal findings of blood chemistry: Secondary | ICD-10-CM | POA: Diagnosis not present

## 2017-11-17 DIAGNOSIS — R69 Illness, unspecified: Secondary | ICD-10-CM | POA: Diagnosis not present

## 2017-11-17 DIAGNOSIS — F419 Anxiety disorder, unspecified: Secondary | ICD-10-CM | POA: Diagnosis not present

## 2017-11-17 LAB — CBC WITH DIFFERENTIAL/PLATELET
BASOS ABS: 0 10*3/uL (ref 0.0–0.1)
Basophils Relative: 0.7 % (ref 0.0–3.0)
EOS ABS: 0.1 10*3/uL (ref 0.0–0.7)
Eosinophils Relative: 2.2 % (ref 0.0–5.0)
HCT: 43.4 % (ref 36.0–46.0)
Hemoglobin: 14.6 g/dL (ref 12.0–15.0)
LYMPHS ABS: 1.7 10*3/uL (ref 0.7–4.0)
Lymphocytes Relative: 33.9 % (ref 12.0–46.0)
MCHC: 33.5 g/dL (ref 30.0–36.0)
MCV: 91.3 fl (ref 78.0–100.0)
MONO ABS: 0.5 10*3/uL (ref 0.1–1.0)
MONOS PCT: 10.7 % (ref 3.0–12.0)
NEUTROS ABS: 2.6 10*3/uL (ref 1.4–7.7)
NEUTROS PCT: 52.5 % (ref 43.0–77.0)
PLATELETS: 294 10*3/uL (ref 150.0–400.0)
RBC: 4.76 Mil/uL (ref 3.87–5.11)
RDW: 13.9 % (ref 11.5–15.5)
WBC: 5 10*3/uL (ref 4.0–10.5)

## 2017-11-17 MED ORDER — CLONAZEPAM 0.25 MG PO TBDP: ORAL_TABLET | ORAL | 0 refills | Status: DC

## 2017-11-17 NOTE — Progress Notes (Signed)
Subjective:    Patient ID: Joanne Ibarra, female    DOB: 1965-07-16, 52 y.o.   MRN: [id number]  HPI  Pt in for a follow up.  Pt has stress and anxiety. She never started effexor. She did use buspar and it did help with anxiety but eventually she got some scattered moderate large bruises arms and legs. But no nose bleed. No hx of trauma. Pt expresses reluctance to use effexor. Pt daughter got arrested past week.   No recent nsaids or aspirin recently.  Also she started vit D recently due to low level.   Pt bp is controlled today.    Review of Systems  Constitutional: Negative for chills, fatigue and fever.  HENT: Negative for congestion and nosebleeds.   Respiratory: Negative for cough, chest tightness, shortness of breath and wheezing.   Cardiovascular: Negative for chest pain and palpitations.  Gastrointestinal: Negative for abdominal pain.  Musculoskeletal: Negative for back pain, joint swelling and neck pain.  Neurological: Negative for dizziness and headaches.  Hematological: Negative for adenopathy. Bruises/bleeds easily.       Slight bruising now.  Psychiatric/Behavioral: Negative for behavioral problems and confusion.   Past Medical History:  Diagnosis Date  . Allergy    seasonal allergies.  . Anxiety    recent  . Dysrhythmia    PALPITATIONS YEARS AGO-NO CURRENT PROBLEMS  . GERD (gastroesophageal reflux disease)    occasional.  . Headache    H/O MIGRAINES  . Hypertension      Social History   Socioeconomic History  . Marital status: Legally Separated    Spouse name: Not on file  . Number of children: Not on file  . Years of education: Not on file  . Highest education level: Not on file  Occupational History  . Not on file  Social Needs  . Financial resource strain: Not on file  . Food insecurity:    Worry: Not on file    Inability: Not on file  . Transportation needs:    Medical: Not on file    Non-medical: Not on file  Tobacco Use  .  Smoking status: Never Smoker  . Smokeless tobacco: Never Used  Substance and Sexual Activity  . Alcohol use: Yes    Comment: OCC. once or twice a year.  . Drug use: No  . Sexual activity: Yes  Lifestyle  . Physical activity:    Days per week: Not on file    Minutes per session: Not on file  . Stress: Not on file  Relationships  . Social connections:    Talks on phone: Not on file    Gets together: Not on file    Attends religious service: Not on file    Active member of club or organization: Not on file    Attends meetings of clubs or organizations: Not on file    Relationship status: Not on file  . Intimate partner violence:    Fear of current or ex partner: Not on file    Emotionally abused: Not on file    Physically abused: Not on file    Forced sexual activity: Not on file  Other Topics Concern  . Not on file  Social History Narrative  . Not on file    Past Surgical History:  Procedure Laterality Date  . CERVICAL BIOPSY  W/ LOOP ELECTRODE EXCISION  2003  . DILATATION & CURETTAGE/HYSTEROSCOPY WITH MYOSURE N/A 06/19/2015   Procedure: DILATATION & CURETTAGE/ HYSTEROSCOPY ;  Surgeon: Herbie Baltimore  Salli Quarry, MD;  Location: [facility];  Service: Gynecology;  Laterality: N/A;  . LEEP N/A 06/19/2015   Procedure: LOOP ELECTROSURGICAL EXCISION PROCEDURE (LEEP);  Surgeon: Gae Dry, MD;  Location: [facility];  Service: Gynecology;  Laterality: N/A;  . lensectomy Bilateral 2016    Family History  Problem Relation Age of Onset  . Asthma Sister   . Food Allergy Sister   . Asthma Daughter   . Allergic rhinitis Neg Hx   . Angioedema Neg Hx   . Eczema Neg Hx   . Immunodeficiency Neg Hx   . Urticaria Neg Hx     No Known Allergies  Current Outpatient Medications on File Prior to Visit  Medication Sig Dispense Refill  . hydrochlorothiazide (HYDRODIURIL) 25 MG tablet Take 1 tablet (25 mg total) by mouth daily. 30 tablet 11  . Multiple Vitamins-Minerals (CENTRUM PO) Take 1 tablet by  mouth daily.     . Vitamin D, Ergocalciferol, (DRISDOL) 50000 units CAPS capsule Take 1 capsule (50,000 Units total) by mouth every 7 (seven) days. 8 capsule 0  . busPIRone (BUSPAR) 7.5 MG tablet Take 1 tablet (7.5 mg total) by mouth 2 (two) times daily. (Patient not taking: Reported on 11/17/2017) 60 tablet 0  . venlafaxine XR (EFFEXOR XR) 37.5 MG 24 hr capsule Take 1 capsule (37.5 mg total) by mouth daily with breakfast. (Patient not taking: Reported on 11/17/2017) 30 capsule 0   No current facility-administered medications on file prior to visit.     BP 125/78   Pulse 74   Temp 98.3 F (36.8 C) (Oral)   Resp 16   Ht 5\' 4"  (1.626 m)   Wt 162 lb 12.8 oz (73.8 kg)   SpO2 98%   BMI 27.94 kg/m       Objective:   Physical Exam  General Mental Status- Alert. General Appearance- Not in acute distress.   Skin General: Color- Normal Color. Moisture- Normal Moisture.  Neck Carotid Arteries- Normal color. Moisture- Normal Moisture. No carotid bruits. No JVD.  Chest and Lung Exam Auscultation: Breath Sounds:-Normal.  Cardiovascular Auscultation:Rythm- Regular. Murmurs & Other Heart Sounds:Auscultation of the heart reveals- No Murmurs.  Abdomen Inspection:-Inspeection Normal. Palpation/Percussion:Note:No mass. Palpation and Percussion of the abdomen reveal- Non Tender, Non Distended + BS, no rebound or guarding.    Neurologic Cranial Nerve exam:- CN III-XII intact(No nystagmus), symmetric smile. Strength:- 5/5 equal and symmetric strength both upper and lower extremities.  Derm- residual light colored bruising both arms some small to large areas. # 6 total both upper ext. Lower ext not see wearing jeans.(but same on legs per pt)      Assessment & Plan:  Your blood pressure is well controlled today. Continue current medication regimen.  For anxiety will dc buspar and use clonazepam. Rx advisement given. Declined using in combo with ssri type med. May need contract if med  effective and you continue.  For extensive bruising will check cbc. Pharmacist did not bleeding can be issue.  For low vit D. Continue with vit D.  Follow up in 2-3 weeks to discuss how you are dong on clonazepam. Also  so we can review contract or other options.  Mackie Pai, PA-C

## 2017-11-17 NOTE — Patient Instructions (Addendum)
Your blood pressure is well controlled today. Continue current medication regimen.  For anxiety will dc buspar and use clonazepam. Rx advisement given. Declined using in combo with ssri type med. May need contract if med effective and you continue.  For extensive bruising will check cbc. Pharmacist did not bleeding can be issue. If cbc/platelets normal but bruising reoccurs then refer to specialist.  For low vit D. Continue with vit D.  Follow up in 2-3 weeks to discuss how you are dong on clonazepam. Also  so we can review contract or other options.

## 2017-11-25 ENCOUNTER — Other Ambulatory Visit: Payer: Self-pay | Admitting: Medical

## 2017-11-27 ENCOUNTER — Other Ambulatory Visit: Payer: Self-pay | Admitting: Medical

## 2017-11-27 NOTE — Telephone Encounter (Signed)
Not sure why getting request for buspar refill. I DC'd that per last note and prescribed clonazepam. So I did not refill buspar.

## 2017-11-30 DIAGNOSIS — Z1231 Encounter for screening mammogram for malignant neoplasm of breast: Secondary | ICD-10-CM | POA: Diagnosis not present

## 2017-12-06 ENCOUNTER — Telehealth: Payer: Self-pay | Admitting: *Deleted

## 2017-12-06 DIAGNOSIS — R928 Other abnormal and inconclusive findings on diagnostic imaging of breast: Secondary | ICD-10-CM | POA: Diagnosis not present

## 2017-12-06 DIAGNOSIS — N6489 Other specified disorders of breast: Secondary | ICD-10-CM | POA: Diagnosis not present

## 2017-12-06 NOTE — Telephone Encounter (Signed)
Received Physician Orders from [place]; forwarded to provider/SLS 11/20

## 2017-12-08 ENCOUNTER — Encounter: Payer: Self-pay | Admitting: Medical

## 2017-12-08 ENCOUNTER — Ambulatory Visit (INDEPENDENT_AMBULATORY_CARE_PROVIDER_SITE_OTHER): Payer: 59 | Admitting: Medical

## 2017-12-08 VITALS — BP 113/71 | HR 64 | Temp 98.2°F | Resp 16 | Ht 64.0 in | Wt 164.4 lb

## 2017-12-08 DIAGNOSIS — I10 Essential (primary) hypertension: Secondary | ICD-10-CM | POA: Diagnosis not present

## 2017-12-08 DIAGNOSIS — F419 Anxiety disorder, unspecified: Secondary | ICD-10-CM | POA: Diagnosis not present

## 2017-12-08 DIAGNOSIS — R252 Cramp and spasm: Secondary | ICD-10-CM

## 2017-12-08 DIAGNOSIS — R69 Illness, unspecified: Secondary | ICD-10-CM | POA: Diagnosis not present

## 2017-12-08 DIAGNOSIS — Z1211 Encounter for screening for malignant neoplasm of colon: Secondary | ICD-10-CM | POA: Diagnosis not present

## 2017-12-08 DIAGNOSIS — Z79899 Other long term (current) drug therapy: Secondary | ICD-10-CM | POA: Diagnosis not present

## 2017-12-08 LAB — COMPREHENSIVE METABOLIC PANEL
ALK PHOS: 85 U/L (ref 39–117)
ALT: 14 U/L (ref 0–35)
AST: 18 U/L (ref 0–37)
Albumin: 4.2 g/dL (ref 3.5–5.2)
BUN: 16 mg/dL (ref 6–23)
CHLORIDE: 103 meq/L (ref 96–112)
CO2: 32 mEq/L (ref 19–32)
Calcium: 9.4 mg/dL (ref 8.4–10.5)
Creatinine, Ser: 0.92 mg/dL (ref 0.40–1.20)
GFR: 67.93 mL/min (ref >60.00)
GLUCOSE: 84 mg/dL (ref 70–99)
POTASSIUM: 3.9 meq/L (ref 3.5–5.1)
Sodium: 141 mEq/L (ref 135–145)
Total Bilirubin: 0.4 mg/dL (ref 0.2–1.2)
Total Protein: 6.8 g/dL (ref 6.0–8.3)

## 2017-12-08 LAB — MAGNESIUM: MAGNESIUM: 2.1 mg/dL (ref 1.5–2.5)

## 2017-12-08 NOTE — Patient Instructions (Signed)
Your blood pressure is well controlled.  Continue current blood pressure medication.  You do have some recent cramping and this might be from the diuretic.  We will get a metabolic panel today and check potassium level.  You might need to be on a low dose potassium is unable to keep potassium level in normal range through diet.  For history of anxiety, you are doing well with low-dose clonazepam daily.  You signed controlled medication contract today and gave her a UDS.  When you need refill on your current clonazepam prescription please notify me about 2 or 3 days in advance and I can send in the refill.  Referral to GI for screening colonoscopy placed.  Follow-up in 4 to 6 months or as needed.

## 2017-12-08 NOTE — Progress Notes (Signed)
Subjective:    Patient ID: Joanne Ibarra, female    DOB: 1965-10-09, 52 y.o.   MRN: [id number]  HPI  Pt in for follow up.  Pt bp is very well controlled today. She is checking bp and getting good levels like today.. Mild low k level 2 weeks ago. Advised k rich diet. She does have some mild cramps.  Pt is doing well with only clonazepam. Anxiety is controlled. She had bruising with effexor and buspar. She states can get by with just one clonazepam. She takes it in the morning.   Low vit d. She is on rx of vit presently.    Review of Systems  Constitutional: Negative for chills, fatigue and fever.  Respiratory: Negative for cough, chest tightness and shortness of breath.   Cardiovascular: Negative for chest pain, palpitations and leg swelling.  Gastrointestinal: Negative for abdominal pain, nausea and vomiting.  Genitourinary: Negative for difficulty urinating, dysuria and frequency.  Musculoskeletal: Negative for neck pain and neck stiffness.       Cramps in legs intermittent.  Skin: Negative for color change and rash.  Neurological: Negative for dizziness, tremors, seizures, syncope, facial asymmetry, speech difficulty, weakness, light-headedness, numbness and headaches.  Hematological: Negative for adenopathy. Does not bruise/bleed easily.  Psychiatric/Behavioral: Negative for agitation, behavioral problems, confusion, hallucinations, sleep disturbance and suicidal ideas. The patient is nervous/anxious.    Past Medical History:  Diagnosis Date  . Allergy    seasonal allergies.  . Anxiety    recent  . Dysrhythmia    PALPITATIONS YEARS AGO-NO CURRENT PROBLEMS  . GERD (gastroesophageal reflux disease)    occasional.  . Headache    H/O MIGRAINES  . Hypertension      Social History   Socioeconomic History  . Marital status: Legally Separated    Spouse name: Not on file  . Number of children: Not on file  . Years of education: Not on file  . Highest education  level: Not on file  Occupational History  . Not on file  Social Needs  . Financial resource strain: Not on file  . Food insecurity:    Worry: Not on file    Inability: Not on file  . Transportation needs:    Medical: Not on file    Non-medical: Not on file  Tobacco Use  . Smoking status: Never Smoker  . Smokeless tobacco: Never Used  Substance and Sexual Activity  . Alcohol use: Yes    Comment: OCC. once or twice a year.  . Drug use: No  . Sexual activity: Yes  Lifestyle  . Physical activity:    Days per week: Not on file    Minutes per session: Not on file  . Stress: Not on file  Relationships  . Social connections:    Talks on phone: Not on file    Gets together: Not on file    Attends religious service: Not on file    Active member of club or organization: Not on file    Attends meetings of clubs or organizations: Not on file    Relationship status: Not on file  . Intimate partner violence:    Fear of current or ex partner: Not on file    Emotionally abused: Not on file    Physically abused: Not on file    Forced sexual activity: Not on file  Other Topics Concern  . Not on file  Social History Narrative  . Not on file    Past Surgical  History:  Procedure Laterality Date  . CERVICAL BIOPSY  W/ LOOP ELECTRODE EXCISION  2003  . DILATATION & CURETTAGE/HYSTEROSCOPY WITH MYOSURE N/A 06/19/2015   Procedure: DILATATION & CURETTAGE/ HYSTEROSCOPY ;  Surgeon: Gae Dry, MD;  Location: [facility];  Service: Gynecology;  Laterality: N/A;  . LEEP N/A 06/19/2015   Procedure: LOOP ELECTROSURGICAL EXCISION PROCEDURE (LEEP);  Surgeon: Gae Dry, MD;  Location: [facility];  Service: Gynecology;  Laterality: N/A;  . lensectomy Bilateral 2016    Family History  Problem Relation Age of Onset  . Asthma Sister   . Food Allergy Sister   . Asthma Daughter   . Allergic rhinitis Neg Hx   . Angioedema Neg Hx   . Eczema Neg Hx   . Immunodeficiency Neg Hx   . Urticaria Neg Hx       No Known Allergies  Current Outpatient Medications on File Prior to Visit  Medication Sig Dispense Refill  . clonazePAM (KLONOPIN) 0.25 MG disintegrating tablet 1 tab po bid prn anxiety 60 tablet 0  . hydrochlorothiazide (HYDRODIURIL) 25 MG tablet Take 1 tablet (25 mg total) by mouth daily. 30 tablet 11  . Multiple Vitamins-Minerals (CENTRUM PO) Take 1 tablet by mouth daily.     . Vitamin D, Ergocalciferol, (DRISDOL) 50000 units CAPS capsule Take 1 capsule (50,000 Units total) by mouth every 7 (seven) days. 8 capsule 0   No current facility-administered medications on file prior to visit.     BP 113/71   Pulse 64   Temp 98.2 F (36.8 C) (Oral)   Resp 16   Ht 5\' 4"  (1.626 m)   Wt 164 lb 6.4 oz (74.6 kg)   SpO2 99%   BMI 28.22 kg/m       Objective:   Physical Exam  General Mental Status- Alert. General Appearance- Not in acute distress.   Skin General: Color- Normal Color. Moisture- Normal Moisture.  Neck Carotid Arteries- Normal color. Moisture- Normal Moisture. No carotid bruits. No JVD.  Chest and Lung Exam Auscultation: Breath Sounds:-Normal.  Cardiovascular Auscultation:Rythm- Regular. Murmurs & Other Heart Sounds:Auscultation of the heart reveals- No Murmurs.   Neurologic Cranial Nerve exam:- CN III-XII intact(No nystagmus), symmetric smile. Strength:- 5/5 equal and symmetric strength both upper and lower extremities.      Assessment & Plan:  Your blood pressure is well controlled.  Continue current blood pressure medication.  You do have some recent cramping and this might be from the diuretic.  We will get a metabolic panel today and check potassium level.  You might need to be on a low dose potassium is unable to keep potassium level in normal range through diet.  For history of anxiety, you are doing well with low-dose clonazepam daily.  You signed controlled medication contract today and gave her a UDS.  When you need refill on your current  clonazepam prescription please notify me about 2 or 3 days in advance and I can send in the refill.  Referral to GI for screening colonoscopy placed.  Follow-up in 4 to 6 months or as needed.

## 2017-12-09 LAB — PAIN MGMT, PROFILE 8 W/CONF, U
6 Acetylmorphine: NEGATIVE ng/mL (ref <10)
ALCOHOL METABOLITES: NEGATIVE ng/mL (ref <500)
AMPHETAMINES: NEGATIVE ng/mL (ref <500)
BENZODIAZEPINES: NEGATIVE ng/mL (ref <100)
BUPRENORPHINE, URINE: NEGATIVE ng/mL (ref <5)
COCAINE METABOLITE: NEGATIVE ng/mL (ref <150)
Creatinine: 95.6 mg/dL
MDMA: NEGATIVE ng/mL (ref <500)
Marijuana Metabolite: NEGATIVE ng/mL (ref <20)
OXIDANT: NEGATIVE ug/mL (ref <200)
OXYCODONE: NEGATIVE ng/mL (ref <100)
Opiates: NEGATIVE ng/mL (ref <100)
pH: 6.84 (ref 4.5–9.0)

## 2018-02-07 ENCOUNTER — Encounter: Payer: Self-pay | Admitting: Medical

## 2018-02-07 ENCOUNTER — Encounter: Payer: Self-pay | Admitting: Obstetrics & Gynecology

## 2018-02-07 ENCOUNTER — Other Ambulatory Visit (HOSPITAL_COMMUNITY)
Admission: RE | Admit: 2018-02-07 | Discharge: 2018-02-07 | Disposition: A | Discharge status: Home / Self Care | Payer: 59 | Source: Ambulatory Visit | Attending: Obstetrics & Gynecology | Admitting: Obstetrics & Gynecology

## 2018-02-07 ENCOUNTER — Ambulatory Visit (INDEPENDENT_AMBULATORY_CARE_PROVIDER_SITE_OTHER): Payer: 59 | Admitting: Obstetrics & Gynecology

## 2018-02-07 VITALS — BP 122/88 | Ht 64.0 in | Wt 167.0 lb

## 2018-02-07 DIAGNOSIS — Z87410 Personal history of cervical dysplasia: Secondary | ICD-10-CM

## 2018-02-07 DIAGNOSIS — Z01419 Encounter for gynecological examination (general) (routine) without abnormal findings: Secondary | ICD-10-CM | POA: Diagnosis not present

## 2018-02-07 NOTE — Patient Instructions (Signed)
PAP every year Mammogram every year Colonoscopy every 10 years Labs yearly (with PCP)   

## 2018-02-07 NOTE — Progress Notes (Signed)
HPI:      Ms. Joanne Ibarra is a 53 y.o. [id number] who LMP was in the past, she presents today for her annual examination.  The patient has no complaints today. The patient is sexually active. 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  The patient does perform self breast exams.  There is no notable family history of breast or ovarian cancer in her family. The patient is not taking hormone replacement therapy. Patient denies post-menopausal vaginal bleeding.   The patient has regular exercise: yes. The patient denies current symptoms of depression.    GYN Hx: Last Colonoscopy:never done, but is scheduled this year for that  PMHx: Past Medical History:  Diagnosis Date  . Allergy    seasonal allergies.  . Anxiety    recent  . Dysrhythmia    PALPITATIONS YEARS AGO-NO CURRENT PROBLEMS  . GERD (gastroesophageal reflux disease)    occasional.  . Headache    H/O MIGRAINES  . Hypertension    Past Surgical History:  Procedure Laterality Date  . CERVICAL BIOPSY  W/ LOOP ELECTRODE EXCISION  2003  . DILATATION & CURETTAGE/HYSTEROSCOPY WITH MYOSURE N/A 06/19/2015   Procedure: DILATATION & CURETTAGE/ HYSTEROSCOPY ;  Surgeon: Gae Dry, MD;  Location: [facility];  Service: Gynecology;  Laterality: N/A;  . LEEP N/A 06/19/2015   Procedure: LOOP ELECTROSURGICAL EXCISION PROCEDURE (LEEP);  Surgeon: Gae Dry, MD;  Location: [facility];  Service: Gynecology;  Laterality: N/A;  . lensectomy Bilateral 2016   Family History  Problem Relation Age of Onset  . Asthma Sister   . Food Allergy Sister   . Asthma Daughter   . Allergic rhinitis Neg Hx   . Angioedema Neg Hx   . Eczema Neg Hx   . Immunodeficiency Neg Hx   . Urticaria Neg Hx    Social History   Tobacco Use  . Smoking status: Never Smoker  . Smokeless tobacco: Never Used  Substance Use Topics  . Alcohol use: Yes    Comment: OCC. once or twice a year.  . Drug use: No    Current Outpatient Medications:  .  hydrochlorothiazide (HYDRODIURIL) 25 MG tablet, Take 1 tablet (25 mg total) by mouth daily., Disp: 30 tablet, Rfl: 11 .  clonazePAM (KLONOPIN) 0.25 MG disintegrating tablet, 1 tab po bid prn anxiety (Patient not taking: Reported on 02/07/2018), Disp: 60 tablet, Rfl: 0 .  Multiple Vitamins-Minerals (CENTRUM PO), Take 1 tablet by mouth daily. , Disp: , Rfl:  .  Vitamin D, Ergocalciferol, (DRISDOL) 50000 units CAPS capsule, Take 1 capsule (50,000 Units total) by mouth every 7 (seven) days. (Patient not taking: Reported on 02/07/2018), Disp: 8 capsule, Rfl: 0 Allergies: Patient has no known allergies.  Review of Systems  Constitutional: Negative for chills, fever and malaise/fatigue.  HENT: Negative for congestion, sinus pain and sore throat.   Eyes: Negative for blurred vision and pain.  Respiratory: Negative for cough and wheezing.   Cardiovascular: Negative for chest pain and leg swelling.  Gastrointestinal: Negative for abdominal pain, constipation, diarrhea, heartburn, nausea and vomiting.  Genitourinary: Negative for dysuria, frequency, hematuria and urgency.  Musculoskeletal: Negative for back pain, joint pain, myalgias and neck pain.  Skin: Negative for itching and rash.  Neurological: Negative for dizziness, tremors and weakness.  Endo/Heme/Allergies: Does not bruise/bleed easily.  Psychiatric/Behavioral: Negative for depression. The patient is not nervous/anxious  and does not have insomnia.     Objective: BP 122/88   Ht 5\' 4"  (1.626 m)   Wt 167 lb (75.8 kg)   BMI 28.67 kg/m   Filed Weights   02/07/18 0944  Weight: 167 lb (75.8 kg)   Body mass index is 28.67 kg/m. Physical Exam Constitutional:      General: She is not in acute distress.    Appearance: She is well-developed.  Genitourinary:     Pelvic exam was performed with patient supine.     Vagina, uterus and rectum normal.      No lesions in the vagina.     No vaginal bleeding.     No cervical motion tenderness, friability, lesion or polyp.     Uterus is mobile.     Uterus is not enlarged.     No uterine mass detected.    Uterus is midaxial.     No right or left adnexal mass present.     Right adnexa not tender.     Left adnexa not tender.     Genitourinary Comments: Cervix flush w vag apex  HENT:     Head: Normocephalic and atraumatic. No laceration.     Right Ear: Hearing normal.     Left Ear: Hearing normal.     Mouth/Throat:     Pharynx: Uvula midline.  Eyes:     Pupils: Pupils are equal, round, and reactive to light.  Neck:     Musculoskeletal: Normal range of motion and neck supple.     Thyroid: No thyromegaly.  Cardiovascular:     Rate and Rhythm: Normal rate and regular rhythm.     Heart sounds: No murmur. No friction rub. No gallop.   Pulmonary:     Effort: Pulmonary effort is normal. No respiratory distress.     Breath sounds: Normal breath sounds. No wheezing.  Chest:     Breasts:        Right: No mass, skin change or tenderness.        Left: No mass, skin change or tenderness.  Abdominal:     General: Bowel sounds are normal. There is no distension.     Palpations: Abdomen is soft.     Tenderness: There is no abdominal tenderness. There is no rebound.  Musculoskeletal: Normal range of motion.  Neurological:     Mental Status: She is alert and oriented to person, place, and time.     Cranial Nerves: No cranial nerve deficit.  Skin:    General: Skin is warm and dry.  Psychiatric:        Judgment: Judgment normal.  Vitals signs reviewed.   Assessment: Annual Exam 1. Women's annual routine gynecological examination   2. History of cervical dysplasia    Plan:            1.  Cervical Screening-  Pap smear done today  2. Breast screening- Exam annually and mammogram scheduled  3. Colonoscopy every 10 years, Hemoccult testing after age 3  4. Labs managed by PCP  5.  Counseling for hormonal therapy: none     F/U  Return in about 1 year (around 02/08/2019) for Annual.  Barnett Applebaum, MD, [facility], [facility] 02/07/2018  10:04 AM

## 2018-02-09 LAB — CYTOLOGY - PAP: HPV: DETECTED — AB

## 2018-02-12 NOTE — Progress Notes (Signed)
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 D/w Pt

## 2018-06-13 ENCOUNTER — Other Ambulatory Visit: Payer: Self-pay

## 2018-06-13 ENCOUNTER — Ambulatory Visit (INDEPENDENT_AMBULATORY_CARE_PROVIDER_SITE_OTHER): Payer: 59 | Admitting: Medical

## 2018-06-13 DIAGNOSIS — R87619 Unspecified abnormal cytological findings in specimens from cervix uteri: Secondary | ICD-10-CM | POA: Diagnosis not present

## 2018-06-13 DIAGNOSIS — R69 Illness, unspecified: Secondary | ICD-10-CM | POA: Diagnosis not present

## 2018-06-13 DIAGNOSIS — I10 Essential (primary) hypertension: Secondary | ICD-10-CM | POA: Diagnosis not present

## 2018-06-13 DIAGNOSIS — F419 Anxiety disorder, unspecified: Secondary | ICD-10-CM

## 2018-06-13 DIAGNOSIS — R252 Cramp and spasm: Secondary | ICD-10-CM | POA: Diagnosis not present

## 2018-06-13 NOTE — Progress Notes (Signed)
Subjective:    Patient ID: Joanne Ibarra, female    DOB: 09-16-1965, 53 y.o.   MRN: [id number]  HPI  Virtual Visit via Video Note  I connected with Joanne Ibarra on 06/13/18 at  8:00 AM EDT by a video enabled telemedicine application and verified that I am speaking with the correct person using two identifiers.  Location: Patient: home  Provider: office  Pt has no bp machine to check.    I discussed the limitations of evaluation and management by telemedicine and the availability of in person appointments. The patient expressed understanding and agreed to proceed.  History of Present Illness:   Pt states anxiety is well controlled recently. Over past 2 months has been working from home.  Pt states life at home recently less stress. She has some clonazepam left. Only filled on rx.   Hx of htn. Getting some exercise. On hctz. Pt thinks her lipid panel at gynecologist. Pt gyn is not covered under her insurance. No muscle cramps.  Pt had pap that was irregular in January. Will repeat pap in July. She needs new gyn that accepts her insurance.     Observations/Objective: General-no acute distress, pleasant, oriented. Lungs- on inspection lungs appear unlabored. Neck- no tracheal deviation or jvd on inspection. Neuro- gross motor function appears intact.  Assessment and Plan: Anxiety is well controlled. Currently/past 2 months no clonazepam needed. Happy to hear that but if anxiety returns and need refill of meds let us know.  For htn, continue hctz and recommend get bp cuff over the counter to check occasionally. May come in hand in future for virtual visits during pandemic.  Leg cramping resolved. cmp and mg was normal.  For abnormal pap, refer to new gyn. If no call by 2 weeks my chart me for update on referral.  Check with old gyn to see if lipid panel done on last visit. Would like a copy if so. If not then will place order to get.  Follow up in 6 months or as  needed  Follow Up Instructions:    I discussed the assessment and treatment plan with the patient. The patient was provided an opportunity to ask questions and all were answered. The patient agreed with the plan and demonstrated an understanding of the instructions.   The patient was advised to call back or seek an in-person evaluation if the symptoms worsen or if the condition fails to improve as anticipated.     Mackie Pai, PA-C   Review of Systems  Constitutional: Negative for chills and fever.  HENT: Negative for congestion, ear discharge and facial swelling.   Eyes: Negative for photophobia.  Respiratory: Negative for cough, shortness of breath and wheezing.   Cardiovascular: Negative for chest pain, palpitations and leg swelling.  Gastrointestinal: Negative for abdominal pain.  Genitourinary: Negative for difficulty urinating, dysuria, frequency, menstrual problem and urgency.  Musculoskeletal: Positive for neck pain. Negative for back pain.  Skin: Negative for rash.  Neurological: Negative for dizziness, tremors, seizures and weakness.  Psychiatric/Behavioral: Negative for agitation, behavioral problems, dysphoric mood and suicidal ideas. The patient is not nervous/anxious.    Past Medical History:  Diagnosis Date  . Allergy    seasonal allergies.  . Anxiety    recent  . Dysrhythmia    PALPITATIONS YEARS AGO-NO CURRENT PROBLEMS  . GERD (gastroesophageal reflux disease)    occasional.  . Headache    H/O MIGRAINES  . Hypertension      Social History  Socioeconomic History  . Marital status: Legally Separated    Spouse name: Not on file  . Number of children: Not on file  . Years of education: Not on file  . Highest education level: Not on file  Occupational History  . Not on file  Social Needs  . Financial resource strain: Not on file  . Food insecurity:    Worry: Not on file    Inability: Not on file  . Transportation needs:    Medical: Not on  file    Non-medical: Not on file  Tobacco Use  . Smoking status: Never Smoker  . Smokeless tobacco: Never Used  Substance and Sexual Activity  . Alcohol use: Yes    Comment: OCC. once or twice a year.  . Drug use: No  . Sexual activity: Yes  Lifestyle  . Physical activity:    Days per week: Not on file    Minutes per session: Not on file  . Stress: Not on file  Relationships  . Social connections:    Talks on phone: Not on file    Gets together: Not on file    Attends religious service: Not on file    Active member of club or organization: Not on file    Attends meetings of clubs or organizations: Not on file    Relationship status: Not on file  . Intimate partner violence:    Fear of current or ex partner: Not on file    Emotionally abused: Not on file    Physically abused: Not on file    Forced sexual activity: Not on file  Other Topics Concern  . Not on file  Social History Narrative  . Not on file    Past Surgical History:  Procedure Laterality Date  . CERVICAL BIOPSY  W/ LOOP ELECTRODE EXCISION  2003  . DILATATION & CURETTAGE/HYSTEROSCOPY WITH MYOSURE N/A 06/19/2015   Procedure: DILATATION & CURETTAGE/ HYSTEROSCOPY ;  Surgeon: Gae Dry, MD;  Location: [facility];  Service: Gynecology;  Laterality: N/A;  . LEEP N/A 06/19/2015   Procedure: LOOP ELECTROSURGICAL EXCISION PROCEDURE (LEEP);  Surgeon: Gae Dry, MD;  Location: [facility];  Service: Gynecology;  Laterality: N/A;  . lensectomy Bilateral 2016    Family History  Problem Relation Age of Onset  . Asthma Sister   . Food Allergy Sister   . Asthma Daughter   . Allergic rhinitis Neg Hx   . Angioedema Neg Hx   . Eczema Neg Hx   . Immunodeficiency Neg Hx   . Urticaria Neg Hx     No Known Allergies  Current Outpatient Medications on File Prior to Visit  Medication Sig Dispense Refill  . clonazePAM (KLONOPIN) 0.25 MG disintegrating tablet 1 tab po bid prn anxiety 60 tablet 0  . hydrochlorothiazide  (HYDRODIURIL) 25 MG tablet Take 1 tablet (25 mg total) by mouth daily. 30 tablet 11  . Multiple Vitamins-Minerals (CENTRUM PO) Take 1 tablet by mouth daily.     . Vitamin D, Ergocalciferol, (DRISDOL) 50000 units CAPS capsule Take 1 capsule (50,000 Units total) by mouth every 7 (seven) days. 8 capsule 0   No current facility-administered medications on file prior to visit.     There were no vitals taken for this visit.      Objective:   Physical Exam        Assessment & Plan:

## 2018-06-13 NOTE — Patient Instructions (Signed)
Anxiety is well controlled. Currently/past 2 months no clonazepam needed. Happy to hear that but if anxiety returns and need refill of meds let us know.  For htn, continue hctz and recommend get bp cuff over the counter to check occasionally. May come in hand in future for virtual visits during pandemic.  Leg cramping resolved. cmp and mg was normal.  For abnormal pap, refer to new gyn. If no call by 2 weeks my chart me for update on referral.  Check with old gyn to see if lipid panel done on last visit. Would like a copy if so. If not then will place order to get.  Follow up in 6 months or as needed

## 2018-06-18 DIAGNOSIS — R69 Illness, unspecified: Secondary | ICD-10-CM | POA: Diagnosis not present

## 2018-07-18 ENCOUNTER — Ambulatory Visit: Payer: 59 | Admitting: Obstetrics & Gynecology

## 2018-08-08 ENCOUNTER — Encounter: Payer: Self-pay | Admitting: Obstetrics & Gynecology

## 2018-08-08 ENCOUNTER — Ambulatory Visit (INDEPENDENT_AMBULATORY_CARE_PROVIDER_SITE_OTHER): Payer: 59 | Admitting: Obstetrics & Gynecology

## 2018-08-08 ENCOUNTER — Other Ambulatory Visit: Payer: Self-pay

## 2018-08-08 VITALS — BP 124/77 | HR 73 | Ht 64.0 in | Wt 178.1 lb

## 2018-08-08 DIAGNOSIS — R87612 Low grade squamous intraepithelial lesion on cytologic smear of cervix (LGSIL): Secondary | ICD-10-CM | POA: Diagnosis not present

## 2018-08-08 NOTE — Progress Notes (Signed)
Pt was referred for eval of LGSIL on PAP. 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 She was given a cream which has caused burning so she stopped it.   06/19/2015 DIAGNOSIS:  A. ENDOCERVICAL CURETTINGS:  - LOW-GRADE SQUAMOUS INTRAEPITHELIAL LESION (LSIL/CIN-1).  - BENIGN ENDOCERVICAL EPITHELIUM.  - RARE STRIPS OF UNREMARKABLE ENDOMETRIUM.   B. ENDOMETRIAL CURETTINGS:  - WEAKLY PROLIFERATIVE ENDOMETRIUM.  - FRAGMENT OF MYOMETRIUM.  - NEGATIVE FOR ATYPIA AND MALIGNANCY.   C. ENDOCERVIX, STITCH AT 12:00; LEEP:  - LOW-GRADE SQUAMOUS INTRAEPITHELIAL LESION (LSIL/CIN-1).  - DYSPLASIA FOCALLY INVOLVES BOTH ENDOCERVICAL AND ECTOCERVICAL INKED  MARGINS.   D. ENDOCERVIX; LEEP:  - ENDOCERVIX, NEGATIVE FOR DYSPLASIA AND MALIGNANCY.   Patient given informed consent, signed copy in the chart, time out was performed.  Placed in lithotomy position. Cervix viewed with speculum and colposcope after application of acetic acid.  02/07/2018 Satisfactory for evaluation endocervical/transformation zone component PRESENT.Abnormal     Diagnosis - LOW GRADE SQUAMOUS INTRAEPITHELIAL LESION: CIN-1/ HPV (LSIL)Abnormal    HPV DETECTEDAbnormal    Comment: Normal Reference Range - NOT Detected  Material Submitted CervicoVaginal Pap [ThinPrep Imaged]Abnormal      Colposcopy adequate? No the cervix is almost completely surgically removed. The OS is stenotic.  Acetowhite lesions? no Punctation? no Mosaicism?  no Abnormal vasculature?  no Biopsies? No  ECC? No  GU: EGBUS: there are several follicles that are open; there is not evidence of genital warts at present lesions Vagina: no blood in vault Cervix: no lesion; no mucopurulent d/c; the cervix is palpable but, only the OS is visible.  Uterus: small, mobile Adnexa: no masses; non tender   Patient was given post procedure instructions.  She will return in 2 weeks for  results.  Joanne Ibarra, M.D., [facility]

## 2018-10-26 DIAGNOSIS — Z23 Encounter for immunization: Secondary | ICD-10-CM | POA: Diagnosis not present

## 2018-12-03 ENCOUNTER — Other Ambulatory Visit: Payer: Self-pay | Admitting: Medical

## 2018-12-06 ENCOUNTER — Encounter: Payer: Self-pay | Admitting: Medical

## 2018-12-06 DIAGNOSIS — Z1231 Encounter for screening mammogram for malignant neoplasm of breast: Secondary | ICD-10-CM | POA: Diagnosis not present

## 2018-12-12 ENCOUNTER — Encounter: Payer: Self-pay | Admitting: Medical

## 2018-12-12 ENCOUNTER — Other Ambulatory Visit: Payer: Self-pay

## 2018-12-12 ENCOUNTER — Encounter: Payer: Self-pay | Admitting: Gastroenterology

## 2018-12-12 ENCOUNTER — Ambulatory Visit (INDEPENDENT_AMBULATORY_CARE_PROVIDER_SITE_OTHER): Payer: 59 | Admitting: Medical

## 2018-12-12 VITALS — BP 120/76

## 2018-12-12 DIAGNOSIS — I10 Essential (primary) hypertension: Secondary | ICD-10-CM | POA: Diagnosis not present

## 2018-12-12 DIAGNOSIS — R87619 Unspecified abnormal cytological findings in specimens from cervix uteri: Secondary | ICD-10-CM

## 2018-12-12 DIAGNOSIS — Z1211 Encounter for screening for malignant neoplasm of colon: Secondary | ICD-10-CM

## 2018-12-12 NOTE — Progress Notes (Signed)
   Subjective:    Patient ID: Joanne Ibarra, female    DOB: 12/11/1965, 53 y.o.   MRN: [id number]  HPI  Virtual Visit via Video Note  I connected with Joanne Ibarra on 12/12/18 at  9:00 AM EST by a video enabled telemedicine application and verified that I am speaking with the correct person using two identifiers.  Location: Patient: working from home. Provider: home   I discussed the limitations of evaluation and management by telemedicine and the availability of in person appointments. The patient expressed understanding and agreed to proceed.  History of Present Illness:  Pt bp controlled recently. Pt has current refills.Pt is on hctz. No recent.   On review pt did not see gi. So will place new order.  Mammogram done last week/repeat annual.  Pt had pap smear in January. Pt never followed up with same gyn. She went to different one who decided not to repeat. See saw gyn on our floor.    Pt states prior anxiety is improved. She only had to use klonopin briefly and did not get refills.     Observations/Objective: General-no acute distress, pleasant, oriented. Lungs- on inspection lungs appear unlabored. Neck- no tracheal deviation or jvd on inspection. Neuro- gross motor function appears intact.   Assessment and Plan: Pt is well controlled. She will continue on hctz but want her to get scheduled for future cmp and lipid panel.  Placed referral to gi again. Hopefully will get done.  Pick up old pap report. Get second opinion since differing opinion on if needs to be repeated. If get outside of [facility] then report will be helpful for gyn to review.  Glad to hear anxiety resolved.  Follow up date to be determined after lab review.  Follow Up Instructions:    I discussed the assessment and treatment plan with the patient. The patient was provided an opportunity to ask questions and all were answered. The patient agreed with the plan and demonstrated an  understanding of the instructions.   The patient was advised to call back or seek an in-person evaluation if the symptoms worsen or if the condition fails to improve as anticipated.  I provided 25 minutes of non-face-to-face time during this encounter.   Mackie Pai, PA-C    Review of Systems  Constitutional: Negative for chills, fatigue and fever.  Respiratory: Negative for cough, chest tightness, shortness of breath and wheezing.   Cardiovascular: Negative for chest pain and palpitations.  Gastrointestinal: Negative for abdominal pain.  Musculoskeletal: Negative for back pain.  Skin: Negative for rash.  Neurological: Negative for dizziness, weakness, light-headedness and numbness.  Hematological: Negative for adenopathy. Does not bruise/bleed easily.  Psychiatric/Behavioral: Negative for behavioral problems, dysphoric mood, sleep disturbance and suicidal ideas. The patient is not nervous/anxious.        Objective:   Physical Exam        Assessment & Plan:

## 2018-12-12 NOTE — Patient Instructions (Addendum)
Pt is well controlled. She will continue on hctz but want her to get scheduled for future cmp and lipid panel.  Placed referral to gi again. Hopefully will get done.  Pick up old pap report. Get second opinion since differing opinion on if needs to be repeated. If get outside of [facility] then report will be helpful for gyn to review.  Glad to hear anxiety resolved.  Follow up date to be determined after lab review.

## 2018-12-26 ENCOUNTER — Ambulatory Visit (INDEPENDENT_AMBULATORY_CARE_PROVIDER_SITE_OTHER): Payer: 59 | Admitting: Medical

## 2018-12-26 ENCOUNTER — Other Ambulatory Visit: Payer: Self-pay

## 2018-12-26 DIAGNOSIS — R0981 Nasal congestion: Secondary | ICD-10-CM | POA: Diagnosis not present

## 2018-12-26 DIAGNOSIS — H9209 Otalgia, unspecified ear: Secondary | ICD-10-CM

## 2018-12-26 DIAGNOSIS — J029 Acute pharyngitis, unspecified: Secondary | ICD-10-CM | POA: Diagnosis not present

## 2018-12-26 MED ORDER — FLUTICASONE PROPIONATE 50 MCG/ACT NA SUSP: 2.0000 | Freq: Every day | NASAL | 1 refills | Status: DC

## 2018-12-26 MED ORDER — BENZONATATE 100 MG PO CAPS: 100.0000 mg | ORAL_CAPSULE | Freq: Three times a day (TID) | ORAL | 0 refills | Status: DC | PRN

## 2018-12-26 MED ORDER — AMOXICILLIN-POT CLAVULANATE 875-125 MG PO TABS: 1.0000 | ORAL_TABLET | Freq: Two times a day (BID) | ORAL | 0 refills | Status: DC

## 2018-12-26 NOTE — Progress Notes (Signed)
Subjective:    Patient ID: Joanne Ibarra, female    DOB: [birth year], 53 y.o.   MRN: [id number]  HPI  Virtual Visit via Video Note  I connected with Joanne Ibarra on 12/26/18 at  1:40 PM EST by a video enabled telemedicine application and verified that I am speaking with the correct person using two identifiers.  Location: Patient: home Provider: office  Pt did not check vitals today.   I discussed the limitations of evaluation and management by telemedicine and the availability of in person appointments. The patient expressed understanding and agreed to proceed.  History of Present Illness: Pt in with some fatigued feeling, diffuse body aches and st. Pt states recent covid test came back negative today.(sent out on Monday)She has ha on and off since Thursday and Friday. Fatigue came on Friday.    She got tested thru work/hospital.  Pt had no fevers. Pt has no change in sense of smell. No diarrhea.   St hurts to swallow own saliva. Rt submandibular node mild tender.  Slight nasal congestion. Cough very rare. No wheezing or sob. She works in hospice.  Mild rt ear sore.    Observations/Objective: General-no acute distress, pleasant, oriented. Lungs- on inspection lungs appear unlabored. Neck- no tracheal deviation or jvd on inspection. Neuro- gross motor function appears intact. heent- moderate swelling and moderate pink red. No dc. Fairly good view with shadow cast.   Assessment and Plan: Recent negative covid test results today. Overall your signs/symptoms suspicious for strep throat with ear infection. Will rx flonase nasal spray, augmentn antibiotic and benzonatate cough tablet.  Work not to be off and return on Tuesday provided symptoms resolved. If symptoms persist to some degree then recommend stay off work and get repeat covid test. Pt expressed understanding.  Follow up date to be determined depending on how she responds to treatment.  Follow Up  Instructions:    I discussed the assessment and treatment plan with the patient. The patient was provided an opportunity to ask questions and all were answered. The patient agreed with the plan and demonstrated an understanding of the instructions.   The patient was advised to call back or seek an in-person evaluation if the symptoms worsen or if the condition fails to improve as anticipated.  I provided  15  minutes of non-face-to-face time during this encounter.   Mackie Pai, PA-C   Review of Systems  Constitutional: Positive for fatigue. Negative for chills and fever.  HENT: Positive for congestion and sore throat.   Respiratory: Positive for cough. Negative for shortness of breath and wheezing.   Cardiovascular: Negative for chest pain and palpitations.  Gastrointestinal: Negative for abdominal pain.  Musculoskeletal: Positive for myalgias.  Neurological: Negative for dizziness, speech difficulty and headaches.  Hematological: Positive for adenopathy.  Psychiatric/Behavioral: Negative for behavioral problems and confusion.    Past Medical History:  Diagnosis Date  . Allergy    seasonal allergies.  . Anxiety    recent  . Dysrhythmia    PALPITATIONS YEARS AGO-NO CURRENT PROBLEMS  . GERD (gastroesophageal reflux disease)    occasional.  . Headache    H/O MIGRAINES  . Hypertension      Social History   Socioeconomic History  . Marital status: Legally Separated    Spouse name: Not on file  . Number of children: Not on file  . Years of education: Not on file  . Highest education level: Not on file  Occupational History  . Not  on file  Social Needs  . Financial resource strain: Not on file  . Food insecurity    Worry: Not on file    Inability: Not on file  . Transportation needs    Medical: Not on file    Non-medical: Not on file  Tobacco Use  . Smoking status: Never Smoker  . Smokeless tobacco: Never Used  Substance and Sexual Activity  . Alcohol use:  Yes    Comment: OCC. once or twice a year.  . Drug use: No  . Sexual activity: Yes  Lifestyle  . Physical activity    Days per week: Not on file    Minutes per session: Not on file  . Stress: Not on file  Relationships  . Social Herbalist on phone: Not on file    Gets together: Not on file    Attends religious service: Not on file    Active member of club or organization: Not on file    Attends meetings of clubs or organizations: Not on file    Relationship status: Not on file  . Intimate partner violence    Fear of current or ex partner: Not on file    Emotionally abused: Not on file    Physically abused: Not on file    Forced sexual activity: Not on file  Other Topics Concern  . Not on file  Social History Narrative  . Not on file    Past Surgical History:  Procedure Laterality Date  . CERVICAL BIOPSY  W/ LOOP ELECTRODE EXCISION  2003  . DILATATION & CURETTAGE/HYSTEROSCOPY WITH MYOSURE N/A 06/19/2015   Procedure: DILATATION & CURETTAGE/ HYSTEROSCOPY ;  Surgeon: Gae Dry, MD;  Location: [facility];  Service: Gynecology;  Laterality: N/A;  . LEEP N/A 06/19/2015   Procedure: LOOP ELECTROSURGICAL EXCISION PROCEDURE (LEEP);  Surgeon: Gae Dry, MD;  Location: [facility];  Service: Gynecology;  Laterality: N/A;  . lensectomy Bilateral 2016    Family History  Problem Relation Age of Onset  . Asthma Sister   . Food Allergy Sister   . Asthma Daughter   . Allergic rhinitis Neg Hx   . Angioedema Neg Hx   . Eczema Neg Hx   . Immunodeficiency Neg Hx   . Urticaria Neg Hx     No Known Allergies  Current Outpatient Medications on File Prior to Visit  Medication Sig Dispense Refill  . clonazePAM (KLONOPIN) 0.25 MG disintegrating tablet 1 tab po bid prn anxiety 60 tablet 0  . hydrochlorothiazide (HYDRODIURIL) 25 MG tablet TAKE 1 TABLET BY MOUTH EVERY DAY 30 tablet 11  . imiquimod (ALDARA) 5 % cream Apply topically 3 (three) times a week.    . Multiple  Vitamins-Minerals (CENTRUM PO) Take 1 tablet by mouth daily.     . Vitamin D, Ergocalciferol, (DRISDOL) 50000 units CAPS capsule Take 1 capsule (50,000 Units total) by mouth every 7 (seven) days. 8 capsule 0   No current facility-administered medications on file prior to visit.     There were no vitals taken for this visit.      Objective:   Physical Exam        Assessment & Plan:

## 2018-12-26 NOTE — Patient Instructions (Signed)
Recent negative covid test results today. Overall your signs/symptoms suspicious for strep throat with ear infection. Will rx flonase nasal spray, augmentn antibiotic and benzonatate cough tablet.  Work not to be off and return on Tuesday provided symptoms resolved. If symptoms persist to some degree then recommend stay off work and get repeat covid test. Pt expressed understanding.  Follow up date to be determined depending on how she responds to treatment.

## 2019-01-09 ENCOUNTER — Other Ambulatory Visit: Payer: Self-pay

## 2019-01-09 ENCOUNTER — Ambulatory Visit (AMBULATORY_SURGERY_CENTER): Payer: 59 | Admitting: *Deleted

## 2019-01-09 VITALS — Temp 96.6°F | Ht 64.0 in | Wt 178.2 lb

## 2019-01-09 DIAGNOSIS — Z1211 Encounter for screening for malignant neoplasm of colon: Secondary | ICD-10-CM

## 2019-01-09 DIAGNOSIS — Z1159 Encounter for screening for other viral diseases: Secondary | ICD-10-CM

## 2019-01-09 MED ORDER — NA SULFATE-K SULFATE-MG SULF 17.5-3.13-1.6 GM/177ML PO SOLN: 1.0000 | Freq: Once | ORAL | 0 refills | Status: AC

## 2019-01-09 NOTE — Progress Notes (Signed)

## 2019-01-17 ENCOUNTER — Other Ambulatory Visit: Payer: Self-pay | Admitting: Medical

## 2019-01-21 ENCOUNTER — Encounter: Payer: Self-pay | Admitting: Gastroenterology

## 2019-01-22 ENCOUNTER — Ambulatory Visit (INDEPENDENT_AMBULATORY_CARE_PROVIDER_SITE_OTHER): Payer: 59

## 2019-01-22 ENCOUNTER — Other Ambulatory Visit: Payer: Self-pay | Admitting: Gastroenterology

## 2019-01-22 DIAGNOSIS — Z1159 Encounter for screening for other viral diseases: Secondary | ICD-10-CM

## 2019-01-23 LAB — SARS CORONAVIRUS 2 (TAT 6-24 HRS): SARS Coronavirus 2: NEGATIVE

## 2019-01-25 ENCOUNTER — Encounter: Payer: Self-pay | Admitting: Gastroenterology

## 2019-01-25 ENCOUNTER — Ambulatory Visit (AMBULATORY_SURGERY_CENTER): Payer: 59 | Admitting: Gastroenterology

## 2019-01-25 ENCOUNTER — Other Ambulatory Visit: Payer: Self-pay

## 2019-01-25 VITALS — BP 111/60 | HR 62 | Temp 98.3°F | Resp 16 | Ht 64.0 in | Wt 178.2 lb

## 2019-01-25 DIAGNOSIS — K635 Polyp of colon: Secondary | ICD-10-CM

## 2019-01-25 DIAGNOSIS — Z1159 Encounter for screening for other viral diseases: Secondary | ICD-10-CM

## 2019-01-25 DIAGNOSIS — N189 Chronic kidney disease, unspecified: Secondary | ICD-10-CM | POA: Diagnosis not present

## 2019-01-25 DIAGNOSIS — Z1211 Encounter for screening for malignant neoplasm of colon: Secondary | ICD-10-CM | POA: Diagnosis not present

## 2019-01-25 DIAGNOSIS — I10 Essential (primary) hypertension: Secondary | ICD-10-CM | POA: Diagnosis not present

## 2019-01-25 DIAGNOSIS — D125 Benign neoplasm of sigmoid colon: Secondary | ICD-10-CM

## 2019-01-25 MED ORDER — SODIUM CHLORIDE 0.9 % IV SOLN: 500.0000 mL | Freq: Once | INTRAVENOUS | Status: DC

## 2019-01-25 NOTE — Patient Instructions (Signed)
Thank you for allowing [place] to care for you today!  Await pathology results of polyp removed.  Will recommend next colonoscopy date at that time.  Resume previous diet and medications today.  Return to your normal activities tomorrow.   YOU HAD AN ENDOSCOPIC PROCEDURE TODAY AT [facility]:   Refer to the procedure report that was given to you for any specific questions about what was found during the examination.  If the procedure report does not answer your questions, please call your gastroenterologist to clarify.  If you requested that your care partner not be given the details of your procedure findings, then the procedure report has been included in a sealed envelope for you to review at your convenience later.  YOU SHOULD EXPECT: Some feelings of bloating in the abdomen. Passage of more gas than usual.  Walking can help get rid of the air that was put into your GI tract during the procedure and reduce the bloating. If you had a lower endoscopy (such as a colonoscopy or flexible sigmoidoscopy) you may notice spotting of blood in your stool or on the toilet paper. If you underwent a bowel prep for your procedure, you may not have a normal bowel movement for a few days.  Please Note:  You might notice some irritation and congestion in your nose or some drainage.  This is from the oxygen used during your procedure.  There is no need for concern and it should clear up in a day or so.  SYMPTOMS TO REPORT IMMEDIATELY:   Following lower endoscopy (colonoscopy or flexible sigmoidoscopy):  Excessive amounts of blood in the stool  Significant tenderness or worsening of abdominal pains  Swelling of the abdomen that is new, acute  Fever of 100F or higher   For urgent or emergent issues, a gastroenterologist can be reached at any hour by calling [phone].   DIET:  We do recommend a small meal at first, but then you may proceed to your regular diet.  Drink plenty of fluids  but you should avoid alcoholic beverages for 24 hours.  ACTIVITY:  You should plan to take it easy for the rest of today and you should NOT DRIVE or use heavy machinery until tomorrow (because of the sedation medicines used during the test).    FOLLOW UP: Our staff will call the number listed on your records 48-72 hours following your procedure to check on you and address any questions or concerns that you may have regarding the information given to you following your procedure. If we do not reach you, we will leave a message.  We will attempt to reach you two times.  During this call, we will ask if you have developed any symptoms of COVID 19. If you develop any symptoms (ie: fever, flu-like symptoms, shortness of breath, cough etc.) before then, please call [phone].  If you test positive for Covid 19 in the 2 weeks post procedure, please call and report this information to [place].    If any biopsies were taken you will be contacted by phone or by letter within the next 1-3 weeks.  Please call us at [phone] if you have not heard about the biopsies in 3 weeks.    SIGNATURES/CONFIDENTIALITY: You and/or your care partner have signed paperwork which will be entered into your electronic medical record.  These signatures attest to the fact that that the information above on your After Visit Summary has been reviewed and is understood.  Full responsibility of the confidentiality of this discharge information lies with you and/or your care-partner.

## 2019-01-25 NOTE — Progress Notes (Signed)
Temp by Levora Dredge by CW  Pt's states no medical or surgical changes since previsit or office visit.

## 2019-01-25 NOTE — Op Note (Signed)
[facility] Patient Name: Joanne Ibarra Procedure Date: 01/25/2019 8:40 AM MRN: [id number] Endoscopist: Jackquline Denmark , MD Age: 54 Referring MD:  Date of Birth: 28-Jun-1965 Gender: Female Account #: [id number] Procedure:                Colonoscopy Indications:              Screening for colorectal malignant neoplasm Medicines:                Monitored Anesthesia Care Procedure:                Pre-Anesthesia Assessment:                           - Prior to the procedure, a History and Physical                            was performed, and patient medications and                            allergies were reviewed. The patient's tolerance of                            previous anesthesia was also reviewed. The risks                            and benefits of the procedure and the sedation                            options and risks were discussed with the patient.                            All questions were answered, and informed consent                            was obtained. Prior Anticoagulants: The patient has                            taken no previous anticoagulant or antiplatelet                            agents. ASA Grade Assessment: I - A normal, healthy                            patient. After reviewing the risks and benefits,                            the patient was deemed in satisfactory condition to                            undergo the procedure.                           After obtaining informed consent, the colonoscope  was passed under direct vision. Throughout the                            procedure, the patient's blood pressure, pulse, and                            oxygen saturations were monitored continuously. The                            Colonoscope was introduced through the anus and                            advanced to the 2 cm into the ileum. The                            colonoscopy was performed without  difficulty. The                            patient tolerated the procedure well. The quality                            of the bowel preparation was good. The terminal                            ileum, ileocecal valve, appendiceal orifice, and                            rectum were photographed. Scope In: 8:58:39 AM Scope Out: 9:11:01 AM Scope Withdrawal Time: 0 hours 9 minutes 34 seconds  Total Procedure Duration: 0 hours 12 minutes 22 seconds  Findings:                 A 6 mm polyp was found in the distal sigmoid colon.                            The polyp was sessile. The polyp was removed with a                            cold snare. Resection and retrieval were complete.                           Non-bleeding internal hemorrhoids were found during                            retroflexion. The hemorrhoids were small.                           The terminal ileum appeared normal.                           The exam was otherwise without abnormality on                            direct and retroflexion views. Complications:  No immediate complications. Estimated Blood Loss:     Estimated blood loss: none. Impression:               - One 6 mm polyp in the distal sigmoid colon,                            removed with a cold snare. Resected and retrieved.                           - Non-bleeding internal hemorrhoids.                           - Otherwise normal colonoscopy to TI. Recommendation:           - Patient has a contact number available for                            emergencies. The signs and symptoms of potential                            delayed complications were discussed with the                            patient. Return to normal activities tomorrow.                            Written discharge instructions were provided to the                            patient.                           - Resume previous diet.                           - Continue present  medications.                           - Await pathology results.                           - Repeat colonoscopy for surveillance based on                            pathology results.                           - Return to GI clinic PRN. Jackquline Denmark, MD 01/25/2019 9:14:59 AM This report has been signed electronically.

## 2019-01-25 NOTE — Progress Notes (Signed)
Called to room to assist during endoscopic procedure.  Patient ID and intended procedure confirmed with present staff. Received instructions for my participation in the procedure from the performing physician.  

## 2019-01-26 DIAGNOSIS — H0289 Other specified disorders of eyelid: Secondary | ICD-10-CM | POA: Diagnosis not present

## 2019-01-26 DIAGNOSIS — I10 Essential (primary) hypertension: Secondary | ICD-10-CM | POA: Diagnosis not present

## 2019-01-26 DIAGNOSIS — H00021 Hordeolum internum right upper eyelid: Secondary | ICD-10-CM | POA: Diagnosis not present

## 2019-01-26 DIAGNOSIS — H547 Unspecified visual loss: Secondary | ICD-10-CM | POA: Diagnosis not present

## 2019-01-29 ENCOUNTER — Telehealth: Payer: Self-pay | Admitting: *Deleted

## 2019-01-29 ENCOUNTER — Encounter: Payer: Self-pay | Admitting: Gastroenterology

## 2019-01-29 NOTE — Telephone Encounter (Signed)
  Follow up Call-  Call back number 01/25/2019  Post procedure Call Back phone  # [phone]  Permission to leave phone message Yes  Some recent data might be hidden     Patient questions:  Do you have a fever, pain , or abdominal swelling? No. Pain Score  0 *  Have you tolerated food without any problems? Yes.    Have you been able to return to your normal activities? Yes.    Do you have any questions about your discharge instructions: Diet   No. Medications  No. Follow up visit  No.  Do you have questions or concerns about your Care? No.  Actions: * If pain score is 4 or above: No action needed, pain <4.  1. Have you developed a fever since your procedure? no  2.   Have you had an respiratory symptoms (SOB or cough) since your procedure? no  3.   Have you tested positive for COVID 19 since your procedure no  4.   Have you had any family members/close contacts diagnosed with the COVID 19 since your procedure?  no   If yes to any of these questions please route to Joylene John, RN and Alphonsa Gin, Therapist, sports.

## 2019-01-30 ENCOUNTER — Telehealth: Payer: Self-pay | Admitting: *Deleted

## 2019-01-30 NOTE — Telephone Encounter (Signed)
Entered chart in error. SM

## 2019-02-17 ENCOUNTER — Other Ambulatory Visit: Payer: Self-pay | Admitting: Medical

## 2019-04-05 ENCOUNTER — Encounter: Payer: Self-pay | Admitting: Obstetrics & Gynecology

## 2019-04-05 ENCOUNTER — Ambulatory Visit (INDEPENDENT_AMBULATORY_CARE_PROVIDER_SITE_OTHER): Payer: 59 | Admitting: Obstetrics & Gynecology

## 2019-04-05 ENCOUNTER — Other Ambulatory Visit: Payer: Self-pay

## 2019-04-05 VITALS — BP 130/61 | HR 76 | Ht 64.0 in | Wt 172.0 lb

## 2019-04-05 DIAGNOSIS — N949 Unspecified condition associated with female genital organs and menstrual cycle: Secondary | ICD-10-CM | POA: Diagnosis not present

## 2019-04-05 DIAGNOSIS — N9089 Other specified noninflammatory disorders of vulva and perineum: Secondary | ICD-10-CM | POA: Diagnosis not present

## 2019-04-05 NOTE — Patient Instructions (Signed)
Molluscum Contagiosum, Adult Molluscum contagiosum is a skin infection that can cause a rash. Your rash may go away on its own, or you may need to have a procedure or use medicine to treat the rash. What are the causes? This condition is caused by a virus. The virus can spread from person to person (is contagious). It can spread through:  Skin-to-skin contact with an infected person.  Contact with an object that has the virus on it (contaminated object), such as a towel or clothing.  Sexual activity. What increases the risk? You may be more likely to develop this condition if you:  Live in an area where the weather is moist and warm.  Have a weak disease-fighting system (immune system). What are the signs or symptoms? The main symptom of this condition is a painless rash that appears 2-7 weeks after exposure to the virus. The rash is made up of small, dome-shaped bumps on the skin. The bumps may:  Affect the genitals, thighs, face, neck, or abdomen.  Be pink or flesh-colored.  Appear one by one or in groups.  Range from the size of a pinhead to the size of a pencil eraser.  Feel firm, smooth, and waxy.  Have a pit in the middle.  Itch. For most people, the rash does not itch. How is this diagnosed? This condition may be diagnosed based on:  Your symptoms and medical history.  A physical exam.  Scraping the bumps to collect a skin sample for testing. How is this treated? The rash usually goes away within 2 months, but it can sometimes take 6-12 months for it to clear completely. For some people, the rash may go away on its own, without treatment. In some cases, treatment may be needed to keep the virus from infecting other people or to keep the rash from spreading to other parts of your body. Treatment may also be done if you have anxiety or stress because of the way the rash looks. If you do need treatment, the options may include:  Surgery to remove the bumps by freezing  them (cryosurgery).  A procedure to scrape off the bumps (curettage).  A procedure to remove the bumps with a laser.  Putting medicine on the bumps (topical treatment). Follow these instructions at home: General instructions  Take or apply over-the-counter and prescription medicines only as told by your health care provider.  Do not scratch or pick at the bumps. Scratching or picking can cause the rash to spread to other parts of your body. Preventing infection As long as you have bumps on your skin, the infection can spread to other people. To prevent this from happening:  Do not share clothing or towels with others until the bumps go away.  Do not use a public swimming pool, sauna, or shower until the bumps go away.  Avoid close contact with others until the bumps go away. This includes sexual contact.  Wash your hands often with soap and water. If soap and water are not available, use hand sanitizer.  Cover the bumps with clothing or a bandage when you will be near other people. Contact a health care provider if:  The bumps are spreading.  The bumps are becoming red and sore.  The bumps have not gone away after 12 months. Summary  Molluscum contagiosum is a skin infection that can cause a rash made up of small, dome-shaped bumps.  The infection is caused by a virus.  The rash usually goes away  within 2 months, but it can sometimes take 6-12 months for it to clear completely.  The rash often goes away on its own. However, treatment is sometimes recommended to keep the virus from infecting other people or to keep it from spreading to other parts of your body. This information is not intended to replace advice given to you by your health care provider. Make sure you discuss any questions you have with your health care provider. Document Revised: 04/27/2018 Document Reviewed: 01/16/2017 Elsevier Patient Education  [place].    Genital Herpes Genital herpes is  a common sexually transmitted infection (STI) that is caused by a virus. The virus spreads from person to person through sexual contact. Infection can cause itching, blisters, and sores around the genitals or rectum. Symptoms may last several days and then go away This is called an outbreak. However, the virus remains in your body, so you may have more outbreaks in the future. The time between outbreaks varies and can be months or years. Genital herpes affects men and women. It is particularly concerning for pregnant women because the virus can be passed to the baby during delivery and can cause serious problems. Genital herpes is also a concern for people who have a weak disease-fighting (immune) system. What are the causes? This condition is caused by the herpes simplex virus (HSV) type 1 or type 2. The virus may spread through:  Sexual contact with an infected person, including vaginal, anal, and oral sex.  Contact with fluid from a herpes sore.  The skin. This means that you can get herpes from an infected partner even if he or she does not have a visible sore or does not know that he or she is infected. What increases the risk? You are more likely to develop this condition if:  You have sex with many partners.  You do not use latex condoms during sex. What are the signs or symptoms? Most people do not have symptoms (asymptomatic) or have mild symptoms that may be mistaken for other skin problems. Symptoms may include:  Small red bumps near the genitals, rectum, or mouth. These bumps turn into blisters and then turn into sores.  Flu-like symptoms, including: ? Fever. ? Body aches. ? Swollen lymph nodes. ? Headache.  Painful urination.  Pain and itching in the genital area or rectal area.  Vaginal discharge.  Tingling or shooting pain in the legs and buttocks. Generally, symptoms are more severe and last longer during the first (primary) outbreak. Flu-like symptoms are also more  common during the primary outbreak. How is this diagnosed? Genital herpes may be diagnosed based on:  A physical exam.  Your medical history.  Blood tests.  A test of a fluid sample (culture) from an open sore. How is this treated? There is no cure for this condition, but treatment with antiviral medicines that are taken by mouth (orally) can do the following:  Speed up healing and relieve symptoms.  Help to reduce the spread of the virus to sexual partners.  Limit the chance of future outbreaks, or make future outbreaks shorter.  Lessen symptoms of future outbreaks. Your health care provider may also recommend pain relief medicines, such as aspirin or ibuprofen. Follow these instructions at home: Sexual activity  Do not have sexual contact during active outbreaks.  Practice safe sex. Latex condoms and female condoms may help prevent the spread of the herpes virus. General instructions  Keep the affected areas dry and clean.  Take  over-the-counter and prescription medicines only as told by your health care provider.  Avoid rubbing or touching blisters and sores. If you do touch blisters or sores: ? Wash your hands thoroughly with soap and water. ? Do not touch your eyes afterward.  To help relieve pain or itching, you may take the following actions as directed by your health care provider: ? Apply a cold, wet cloth (cold compress) to affected areas 4-6 times a day. ? Apply a substance that protects your skin and reduces bleeding (astringent). ? Apply a gel that helps relieve pain around sores (lidocaine gel). ? Take a warm, shallow bath that cleans the genital area (sitz bath).  Keep all follow-up visits as told by your health care provider. This is important. How is this prevented?  Use condoms. Although anyone can get genital herpes during sexual contact, even with the use of a condom, a condom can provide some protection.  Avoid having multiple sexual  partners.  Talk with your sexual partner about any symptoms either of you may have. Also, talk with your partner about any history of STIs.  Get tested for STIs before you have sex. Ask your partner to do the same.  Do not have sexual contact if you have symptoms of genital herpes. Contact a health care provider if:  Your symptoms are not improving with medicine.  Your symptoms return.  You have new symptoms.  You have a fever.  You have abdominal pain.  You have redness, swelling, or pain in your eye.  You notice new sores on other parts of your body.  You are a woman and experience bleeding between menstrual periods.  You have had herpes and you become pregnant or plan to become pregnant. Summary  Genital herpes is a common sexually transmitted infection (STI) that is caused by the herpes simplex virus (HSV) type 1 or type 2.  These viruses are most often spread through sexual contact with an infected person.  You are more likely to develop this condition if you have sex with many partners or you have unprotected sex.  Most people do not have symptoms (asymptomatic) or have mild symptoms that may be mistaken for other skin problems. Symptoms occur as outbreaks that may happen months or years apart.  There is no cure for this condition, but treatment with oral antiviral medicines can reduce symptoms, reduce the chance of spreading the virus to a partner, prevent future outbreaks, or shorten future outbreaks. This information is not intended to replace advice given to you by your health care provider. Make sure you discuss any questions you have with your health care provider. Document Revised: 07/10/2017 Document Reviewed: 12/04/2015 Elsevier Patient Education  [place].

## 2019-04-05 NOTE — Progress Notes (Signed)
History:  54 y.o. [id number] here today for eval of vulvar lesions. Pt reports new onset of several lesions on her vulva and perineum. She reports that they are not painful but, are itching. She is sexually active with a new partner 3 months ago. She does not use condoms.   Pt with no h/o HSV. She reports that the lesions are not painful. But, started out as 'red sores.'  The following portions of the patient's history were reviewed and updated as appropriate: allergies, current medications, past family history, past medical history, past social history, past surgical history and problem list.  Review of Systems:  Pertinent items are noted in HPI.    Objective:  Physical Exam Blood pressure 130/61, pulse 76, height 5\' 4"  (1.626 m), weight 172 lb (78 kg).  CONSTITUTIONAL: Well-developed, well-nourished female in no acute distress.  HENT:  Normocephalic, atraumatic EYES: Conjunctivae and EOM are normal. No scleral icterus.  NECK: Normal range of motion SKIN: Skin is warm and dry. No rash noted. Not diaphoretic.No pallor. [place]: Alert and oriented to person, place, and time. Normal coordination.   Pelvic: on the perineum and the vulva, there are multiple small papules some appear to have an umbilicated center. Several of them are excoriated making and others appear to be vesicular making the ID more difficult.     Assessment & Plan:  Vulvar lesions- suspect molluscum contagiosum   F/u HSV cx  Calamine lotion to affected areas  F/u in 4 weeks or sooner prn  Condoms with intercourse.  Total face-to-face time with patient was 18min.  Greater than 50% was spent in counseling and coordination of care with the patient.   Kaleeya Hancock L. Harraway-Smith, M.D., Cherlynn June

## 2019-04-08 LAB — HERPES SIMPLEX VIRUS CULTURE

## 2019-04-15 DIAGNOSIS — Z01419 Encounter for gynecological examination (general) (routine) without abnormal findings: Secondary | ICD-10-CM | POA: Diagnosis not present

## 2019-04-15 DIAGNOSIS — Z6829 Body mass index (BMI) 29.0-29.9, adult: Secondary | ICD-10-CM | POA: Diagnosis not present

## 2019-05-06 DIAGNOSIS — N871 Moderate cervical dysplasia: Secondary | ICD-10-CM | POA: Diagnosis not present

## 2019-05-06 DIAGNOSIS — N879 Dysplasia of cervix uteri, unspecified: Secondary | ICD-10-CM | POA: Diagnosis not present

## 2019-05-08 ENCOUNTER — Ambulatory Visit: Payer: 59 | Admitting: Obstetrics & Gynecology

## 2019-05-09 ENCOUNTER — Telehealth: Payer: Self-pay | Admitting: *Deleted

## 2019-05-09 DIAGNOSIS — Z13820 Encounter for screening for osteoporosis: Secondary | ICD-10-CM | POA: Diagnosis not present

## 2019-05-09 NOTE — Telephone Encounter (Signed)
Called and left the patient a message to call the office back to schedule an appt

## 2019-05-09 NOTE — Telephone Encounter (Signed)
Patient called back and scheduled an apapt for 4/30 at 11:30am. Gave address and phone number for the clinic. Gave the policy for masks, visitors and parking

## 2019-05-10 ENCOUNTER — Telehealth: Payer: Self-pay | Admitting: *Deleted

## 2019-05-10 NOTE — Telephone Encounter (Signed)
Called and left the patient a message to call the office back to schedule an appt

## 2019-05-15 NOTE — H&P (View-Only) (Signed)
GYNECOLOGIC ONCOLOGY NEW PATIENT CONSULTATION   Patient Name: Joanne Ibarra  Patient Age: 54 y.o. 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  Reviewed cervical dysplasia history and recent biopsy showing high-grade vaginal dysplasia.  Vaginal lesions are small and we discussed multiple treatment options including topical Aldara, excision and laser therapy.  Given there location and size, I would favor laser therapy.  The patient was amenable to proceeding with scheduling this in mid May.  Discussed plan for a colposcopy and vaginoscopy, possible cervical and vaginal biopsies, vaginal laser ablation, possible cervical laser ablation, and any other indicated procedures. The risks of surgery were discussed in detail and she understands these to include infection, injury to adjacent organs; bleeding which may require blood transfusion; anesthesia risk; thromboembolic events; possible death; unforeseen complications; possible need for re-exploration; medical complications such as heart attack, stroke, pleural effusion and pneumonia. The patient will receive DVT and antibiotic prophylaxis as indicated. She voiced a clear understanding. She had the opportunity to ask questions. Perioperative instructions were reviewed with her.   A copy of this note was sent to the patient's referring provider.   40 minutes of total time was spent for this patient encounter, including preparation, face-to-face counseling with the patient and coordination of care, and documentation of the encounter.   Jeral Pinch, MD  Division of Gynecologic Oncology  Department of Obstetrics and Gynecology  [facility]   ___________________________________________  Chief Complaint: Chief Complaint  Patient presents with  . VAIN II (vaginal intraepithelial neoplasia grade II)    New Patient  . CIN I (cervical intraepithelial neoplasia I)    History of Present Illness:  Joanne Ibarra is a 54 y.o. y.o. female who is seen in consultation at the request of Dr. Gaetano Net for an evaluation of high-grade vaginal dysplasia.  Seen on 3/19 with several new vulvar and perineal lesions with associated pruritus, no pain. On exam, these were described as small papules with umbilicated center. Given vesicular appearance of some lesions, HSV culture was obtained, which was negative.   More recently, the patient was seen by Dr. Gaetano Net and underwent Pap testing in the setting of a history of cervical dysplasia on 4/1.  Pap smear revealed LSIL/HPV positive.  Patient then underwent colposcopy on 4/20 which was not satisfactory given cervical stenosis and inability to see a transformational zone given prior cervical procedures.  No biopsy of the cervix was taken.  There were several vaginal lesions with acetowhite changes, 1 of which was biopsied and revealed moderate dysplasia.  Her dysplasia history is notable for cervical dysplasia back in 2005 or 2006, at the time that her daughter was 58 years old.  This was her first abnormal Pap smear and ultimately she underwent a LEEP procedure.  Her Pap smears then normalized until 2017.  At that time she presented with postmenopausal bleeding as well as recurrent CIN-1 and underwent endometrial and endocervical curettings and a LEEP.  Endometrial curetting showed weakly proliferative endometrium.  Endocervical curetting showed CIN1.  Her LEEP was notable for CIN-1 with dysplasia focally involving the endocervical and ectocervical margins.  A top hat appears to have been performed that was negative for dysplasia or malignancy.  She had frequent Pap testing afterwards that was normal until  January 2020.  Her Pap test at that time was L SIL, HPV positive.  Overall she reports doing well.  She had some very slight dark discharge after her biopsy as well as cramping.  She denies any postmenopausal bleeding.  She reports a normal appetite without nausea or vomiting.  She endorses normal bowel and bladder function.  PAST MEDICAL HISTORY:  Past Medical History:  Diagnosis Date  . Allergy    seasonal allergies.  Marland Kitchen Dysrhythmia    PALPITATIONS YEARS AGO-NO CURRENT PROBLEMS  . GERD (gastroesophageal reflux disease)    occasional.  . Headache    H/O MIGRAINES  . Hypertension      PAST SURGICAL HISTORY:  Past Surgical History:  Procedure Laterality Date  . CERVICAL BIOPSY  W/ LOOP ELECTRODE EXCISION  2003  . DILATATION & CURETTAGE/HYSTEROSCOPY WITH MYOSURE N/A 06/19/2015   Procedure: DILATATION & CURETTAGE/ HYSTEROSCOPY ;  Surgeon: Gae Dry, MD;  Location: [facility];  Service: Gynecology;  Laterality: N/A;  . LEEP N/A 06/19/2015   Procedure: LOOP ELECTROSURGICAL EXCISION PROCEDURE (LEEP);  Surgeon: Gae Dry, MD;  Location: [facility];  Service: Gynecology;  Laterality: N/A;  . lensectomy Bilateral 2016    OB/GYN HISTORY:  OB History  Gravida Para Term Preterm AB Living  3 3 2 1  0 2  SAB TAB Ectopic Multiple Live Births  0       3    # Outcome Date GA Lbr Len/2nd Weight Sex Delivery Anes PTL Lv  3 Term 2000 [redacted]w[redacted]d   F Vag-Spont None N LIV  2 Preterm 1998 [redacted]w[redacted]d    Vag-Spont   ND  1 Term 1989 110w0d   M Vag-Spont None N LIV    No LMP recorded. Patient is postmenopausal.  Age at menarche: 71 Age at menopause: 35 Hx of HRT: No Hx of STDs: HPV Last pap: 04/2019 History of abnormal pap smears: Yes, see HPI Remote history of a LEEP in 2003. History of LEEP in 06/2015 for recurrent CIN1. Dysplasia focally involved endo and ectocervical margins. Top hat was negative for dysplasia. Pap in 2018 was normal.  Given LSIL pap in 01/2018, the patient underwent colposcopy in  07/2018, no biopsies taken.  SCREENING STUDIES:  Last mammogram: 11/2018  Last colonoscopy: 01/2019 - hyperplasic polyp Last bone mineral density: 2021  MEDICATIONS: Outpatient Encounter Medications as of 05/17/2019  Medication Sig  . fluticasone (FLONASE) 50 MCG/ACT nasal spray SPRAY 2 SPRAYS INTO EACH NOSTRIL EVERY DAY  . hydrochlorothiazide (HYDRODIURIL) 25 MG tablet TAKE 1 TABLET BY MOUTH EVERY DAY  . Multiple Vitamins-Minerals (CENTRUM PO) Take 1 tablet by mouth daily.   Marland Kitchen omeprazole (PRILOSEC) 20 MG capsule Take 20 mg by mouth daily.  . [DISCONTINUED] clonazePAM (KLONOPIN) 0.25 MG disintegrating tablet 1 tab po bid prn anxiety (Patient not taking: Reported on 01/25/2019)  . [DISCONTINUED] imiquimod (ALDARA) 5 % cream Apply topically 3 (three) times a week.  . [DISCONTINUED] Vitamin D, Ergocalciferol, (DRISDOL) 50000 units CAPS capsule Take 1 capsule (50,000 Units total) by mouth every 7 (seven) days. (Patient not taking: Reported on 04/05/2019)   No facility-administered encounter medications on file as of 05/17/2019.    ALLERGIES:  No Known Allergies   FAMILY HISTORY:  Family History  Problem Relation Age of Onset  . Asthma Sister   . Food Allergy Sister   . Asthma Daughter   . Diabetes Mother   . Hypertension Mother   . Cancer Mother        melonoma  of vulva  . Cancer Father        melanoma  . Breast cancer Cousin   . Allergic rhinitis Neg Hx   . Angioedema Neg Hx   . Eczema Neg Hx   . Immunodeficiency Neg Hx   . Urticaria Neg Hx   . Colon cancer Neg Hx   . Esophageal cancer Neg Hx   . Stomach cancer Neg Hx   . Rectal cancer Neg Hx      SOCIAL HISTORY:    Social Connections:   . Frequency of Communication with Friends and Family:   . Frequency of Social Gatherings with Friends and Family:   . Attends Religious Services:   . Active Member of Clubs or Organizations:   . 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  REVIEW OF SYSTEMS:  Denies appetite changes, fevers, chills, fatigue, unexplained weight changes. Denies hearing loss, neck lumps or masses, mouth sores, ringing in ears or voice changes. Denies cough or wheezing.  Denies shortness of breath. Denies chest pain or palpitations. Denies leg swelling. Denies abdominal distention, pain, blood in stools, constipation, diarrhea, nausea, vomiting, or early satiety. Denies pain with intercourse, dysuria, frequency, hematuria or incontinence. Denies hot flashes, pelvic pain, vaginal bleeding or vaginal discharge.   Denies joint pain, back pain or muscle pain/cramps. Denies itching, rash, or wounds. Denies dizziness, headaches, numbness or seizures. Denies swollen lymph nodes or glands, denies easy bruising or bleeding. Denies anxiety, depression, confusion, or decreased concentration.  Physical Exam:  Vital Signs for this encounter:  Blood pressure 130/88, pulse 65, temperature 98.2 F (36.8 C), temperature source Temporal, resp. rate 16, height 5\' 4"  (1.626 m), weight 166 lb 8 oz (75.5 kg), SpO2 98 %. Body mass index is 28.58 kg/m. General: Alert, oriented, no acute distress.  HEENT: Normocephalic, atraumatic. Sclera anicteric.  Chest: Clear to auscultation bilaterally. No wheezes, rhonchi, or rales. Cardiovascular: Regular rate and rhythm, no murmurs, rubs, or gallops.  Abdomen: Normoactive bowel sounds. Soft, nondistended, nontender to palpation. No masses or hepatosplenomegaly appreciated. No palpable fluid wave.  Extremities: Grossly normal range of motion. Warm, well perfused. No edema bilaterally.  Skin: No rashes or lesions.  Lymphatics: No cervical, supraclavicular, or inguinal adenopathy.  GU:  Normal external female genitalia. No lesions. No discharge or bleeding.             Bladder/urethra:  No lesions or masses, well supported bladder             Vagina: Several small (<1cm) raised, white papular lesions near the cervix  on the anterior and posterior vaginal walls.             Cervix: Flush with the vaginal apex, os appreciated but stenotic.             Uterus: Small, mobile, no parametrial involvement or nodularity.             Adnexa: No masses appreciated.  Rectal: Deferred.  LABORATORY AND RADIOLOGIC DATA:  Outside medical records were reviewed to synthesize the above history, along with the history and physical obtained during the visit.   Lab Results  Component Value Date   WBC 5.0 11/17/2017   HGB 14.6 11/17/2017   HCT 43.4 11/17/2017   PLT 294.0 11/17/2017   GLUCOSE 84 12/08/2017   ALT 14 12/08/2017   AST 18 12/08/2017   NA 141 12/08/2017   K 3.9 12/08/2017  CL 103 12/08/2017   CREATININE 0.92 12/08/2017   BUN 16 12/08/2017   CO2 32 12/08/2017   TSH 1.05 11/03/2017

## 2019-05-15 NOTE — Progress Notes (Signed)
GYNECOLOGIC ONCOLOGY NEW PATIENT CONSULTATION   Patient Name: Joanne Ibarra  Patient Age: 54 y.o. 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  Reviewed cervical dysplasia history and recent biopsy showing high-grade vaginal dysplasia.  Vaginal lesions are small and we discussed multiple treatment options including topical Aldara, excision and laser therapy.  Given there location and size, I would favor laser therapy.  The patient was amenable to proceeding with scheduling this in mid May.  Discussed plan for a colposcopy and vaginoscopy, possible cervical and vaginal biopsies, vaginal laser ablation, possible cervical laser ablation, and any other indicated procedures. The risks of surgery were discussed in detail and she understands these to include infection, injury to adjacent organs; bleeding which may require blood transfusion; anesthesia risk; thromboembolic events; possible death; unforeseen complications; possible need for re-exploration; medical complications such as heart attack, stroke, pleural effusion and pneumonia. The patient will receive DVT and antibiotic prophylaxis as indicated. She voiced a clear understanding. She had the opportunity to ask questions. Perioperative instructions were reviewed with her.   A copy of this note was sent to the patient's referring provider.   40 minutes of total time was spent for this patient encounter, including preparation, face-to-face counseling with the patient and coordination of care, and documentation of the encounter.   Jeral Pinch, MD  Division of Gynecologic Oncology  Department of Obstetrics and Gynecology  [facility]   ___________________________________________  Chief Complaint: Chief Complaint  Patient presents with  . VAIN II (vaginal intraepithelial neoplasia grade II)    New Patient  . CIN I (cervical intraepithelial neoplasia I)    History of Present Illness:  Joanne Ibarra is a 54 y.o. y.o. female who is seen in consultation at the request of Dr. Gaetano Net for an evaluation of high-grade vaginal dysplasia.  Seen on 3/19 with several new vulvar and perineal lesions with associated pruritus, no pain. On exam, these were described as small papules with umbilicated center. Given vesicular appearance of some lesions, HSV culture was obtained, which was negative.   More recently, the patient was seen by Dr. Gaetano Net and underwent Pap testing in the setting of a history of cervical dysplasia on 4/1.  Pap smear revealed LSIL/HPV positive.  Patient then underwent colposcopy on 4/20 which was not satisfactory given cervical stenosis and inability to see a transformational zone given prior cervical procedures.  No biopsy of the cervix was taken.  There were several vaginal lesions with acetowhite changes, 1 of which was biopsied and revealed moderate dysplasia.  Her dysplasia history is notable for cervical dysplasia back in 2005 or 2006, at the time that her daughter was 77 years old.  This was her first abnormal Pap smear and ultimately she underwent a LEEP procedure.  Her Pap smears then normalized until 2017.  At that time she presented with postmenopausal bleeding as well as recurrent CIN-1 and underwent endometrial and endocervical curettings and a LEEP.  Endometrial curetting showed weakly proliferative endometrium.  Endocervical curetting showed CIN1.  Her LEEP was notable for CIN-1 with dysplasia focally involving the endocervical and ectocervical margins.  A top hat appears to have been performed that was negative for dysplasia or malignancy.  She had frequent Pap testing afterwards that was normal until  January 2020.  Her Pap test at that time was L SIL, HPV positive.  Overall she reports doing well.  She had some very slight dark discharge after her biopsy as well as cramping.  She denies any postmenopausal bleeding.  She reports a normal appetite without nausea or vomiting.  She endorses normal bowel and bladder function.  PAST MEDICAL HISTORY:  Past Medical History:  Diagnosis Date  . Allergy    seasonal allergies.  Marland Kitchen Dysrhythmia    PALPITATIONS YEARS AGO-NO CURRENT PROBLEMS  . GERD (gastroesophageal reflux disease)    occasional.  . Headache    H/O MIGRAINES  . Hypertension      PAST SURGICAL HISTORY:  Past Surgical History:  Procedure Laterality Date  . CERVICAL BIOPSY  W/ LOOP ELECTRODE EXCISION  2003  . DILATATION & CURETTAGE/HYSTEROSCOPY WITH MYOSURE N/A 06/19/2015   Procedure: DILATATION & CURETTAGE/ HYSTEROSCOPY ;  Surgeon: Gae Dry, MD;  Location: [facility] ORS;  Service: Gynecology;  Laterality: N/A;  . LEEP N/A 06/19/2015   Procedure: LOOP ELECTROSURGICAL EXCISION PROCEDURE (LEEP);  Surgeon: Gae Dry, MD;  Location: [facility] ORS;  Service: Gynecology;  Laterality: N/A;  . lensectomy Bilateral 2016    OB/GYN HISTORY:  OB History  Gravida Para Term Preterm AB Living  3 3 2 1  0 2  SAB TAB Ectopic Multiple Live Births  0       3    # Outcome Date GA Lbr Len/2nd Weight Sex Delivery Anes PTL Lv  3 Term 2000 [redacted]w[redacted]d   F Vag-Spont None N LIV  2 Preterm 1998 [redacted]w[redacted]d    Vag-Spont   ND  1 Term 1989 [redacted]w[redacted]d   M Vag-Spont None N LIV    No LMP recorded. Patient is postmenopausal.  Age at menarche: 74 Age at menopause: 81 Hx of HRT: No Hx of STDs: HPV Last pap: 04/2019 History of abnormal pap smears: Yes, see HPI Remote history of a LEEP in 2003. History of LEEP in 06/2015 for recurrent CIN1. Dysplasia focally involved endo and ectocervical margins. Top hat was negative for dysplasia. Pap in 2018 was normal.  Given LSIL pap in 01/2018, the patient underwent colposcopy in  07/2018, no biopsies taken.  SCREENING STUDIES:  Last mammogram: 11/2018  Last colonoscopy: 01/2019 - hyperplasic polyp Last bone mineral density: 2021  MEDICATIONS: Outpatient Encounter Medications as of 05/17/2019  Medication Sig  . fluticasone (FLONASE) 50 MCG/ACT nasal spray SPRAY 2 SPRAYS INTO EACH NOSTRIL EVERY DAY  . hydrochlorothiazide (HYDRODIURIL) 25 MG tablet TAKE 1 TABLET BY MOUTH EVERY DAY  . Multiple Vitamins-Minerals (CENTRUM PO) Take 1 tablet by mouth daily.   Marland Kitchen omeprazole (PRILOSEC) 20 MG capsule Take 20 mg by mouth daily.  . [DISCONTINUED] clonazePAM (KLONOPIN) 0.25 MG disintegrating tablet 1 tab po bid prn anxiety (Patient not taking: Reported on 01/25/2019)  . [DISCONTINUED] imiquimod (ALDARA) 5 % cream Apply topically 3 (three) times a week.  . [DISCONTINUED] Vitamin D, Ergocalciferol, (DRISDOL) 50000 units CAPS capsule Take 1 capsule (50,000 Units total) by mouth every 7 (seven) days. (Patient not taking: Reported on 04/05/2019)   No facility-administered encounter medications on file as of 05/17/2019.    ALLERGIES:  No Known Allergies   FAMILY HISTORY:  Family History  Problem Relation Age of Onset  . Asthma Sister   . Food Allergy Sister   . Asthma Daughter   . Diabetes Mother   . Hypertension Mother   . Cancer Mother        melonoma  of vulva  . Cancer Father        melanoma  . Breast cancer Cousin   . Allergic rhinitis Neg Hx   . Angioedema Neg Hx   . Eczema Neg Hx   . Immunodeficiency Neg Hx   . Urticaria Neg Hx   . Colon cancer Neg Hx   . Esophageal cancer Neg Hx   . Stomach cancer Neg Hx   . Rectal cancer Neg Hx      SOCIAL HISTORY:    Social Connections:   . Frequency of Communication with Friends and Family:   . Frequency of Social Gatherings with Friends and Family:   . Attends Religious Services:   . Active Member of Clubs or Organizations:   . 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  REVIEW OF SYSTEMS:  Denies appetite changes, fevers, chills, fatigue, unexplained weight changes. Denies hearing loss, neck lumps or masses, mouth sores, ringing in ears or voice changes. Denies cough or wheezing.  Denies shortness of breath. Denies chest pain or palpitations. Denies leg swelling. Denies abdominal distention, pain, blood in stools, constipation, diarrhea, nausea, vomiting, or early satiety. Denies pain with intercourse, dysuria, frequency, hematuria or incontinence. Denies hot flashes, pelvic pain, vaginal bleeding or vaginal discharge.   Denies joint pain, back pain or muscle pain/cramps. Denies itching, rash, or wounds. Denies dizziness, headaches, numbness or seizures. Denies swollen lymph nodes or glands, denies easy bruising or bleeding. Denies anxiety, depression, confusion, or decreased concentration.  Physical Exam:  Vital Signs for this encounter:  Blood pressure 130/88, pulse 65, temperature 98.2 F (36.8 C), temperature source Temporal, resp. rate 16, height 5\' 4"  (1.626 m), weight 166 lb 8 oz (75.5 kg), SpO2 98 %. Body mass index is 28.58 kg/m. General: Alert, oriented, no acute distress.  HEENT: Normocephalic, atraumatic. Sclera anicteric.  Chest: Clear to auscultation bilaterally. No wheezes, rhonchi, or rales. Cardiovascular: Regular rate and rhythm, no murmurs, rubs, or gallops.  Abdomen: Normoactive bowel sounds. Soft, nondistended, nontender to palpation. No masses or hepatosplenomegaly appreciated. No palpable fluid wave.  Extremities: Grossly normal range of motion. Warm, well perfused. No edema bilaterally.  Skin: No rashes or lesions.  Lymphatics: No cervical, supraclavicular, or inguinal adenopathy.  GU:  Normal external female genitalia. No lesions. No discharge or bleeding.             Bladder/urethra:  No lesions or masses, well supported bladder             Vagina: Several small (<1cm) raised, white papular lesions near the cervix  on the anterior and posterior vaginal walls.             Cervix: Flush with the vaginal apex, os appreciated but stenotic.             Uterus: Small, mobile, no parametrial involvement or nodularity.             Adnexa: No masses appreciated.  Rectal: Deferred.  LABORATORY AND RADIOLOGIC DATA:  Outside medical records were reviewed to synthesize the above history, along with the history and physical obtained during the visit.   Lab Results  Component Value Date   WBC 5.0 11/17/2017   HGB 14.6 11/17/2017   HCT 43.4 11/17/2017   PLT 294.0 11/17/2017   GLUCOSE 84 12/08/2017   ALT 14 12/08/2017   AST 18 12/08/2017   NA 141 12/08/2017   K 3.9 12/08/2017  CL 103 12/08/2017   CREATININE 0.92 12/08/2017   BUN 16 12/08/2017   CO2 32 12/08/2017   TSH 1.05 11/03/2017

## 2019-05-17 ENCOUNTER — Other Ambulatory Visit: Payer: Self-pay | Admitting: Gynecologic Oncology

## 2019-05-17 ENCOUNTER — Inpatient Hospital Stay: Payer: 59 | Attending: Gynecologic Oncology | Admitting: Gynecologic Oncology

## 2019-05-17 ENCOUNTER — Encounter: Payer: Self-pay | Admitting: Gynecologic Oncology

## 2019-05-17 ENCOUNTER — Other Ambulatory Visit: Payer: Self-pay

## 2019-05-17 VITALS — BP 130/88 | HR 65 | Temp 98.2°F | Resp 16 | Ht 64.0 in | Wt 166.5 lb

## 2019-05-17 DIAGNOSIS — N870 Mild cervical dysplasia: Secondary | ICD-10-CM | POA: Diagnosis not present

## 2019-05-17 DIAGNOSIS — N891 Moderate vaginal dysplasia: Secondary | ICD-10-CM

## 2019-05-17 NOTE — Patient Instructions (Addendum)
Plan to have CO2 laser vaporization of the vagina at the [facility] on Jun 05, 2019.  You will receive a phone call from the pre-surgical RN to discuss instructions over the phone and arrange for a COVID test which occurs around 3 days prior to the procedure and they ask that you self quarantine until the procedure.  Please call for any questions or concerns.  You can use Tylenol and ibuprofen over the counter for pain relief after the procedure.   Vaginal Laser Surgery  Laser surgery is a procedure that uses a focused beam of light called a laser to shrink, destroy, or remove tissue from the vagina. You may have this surgery if:  You have abnormal cells (dysplasia) in your cervix or vagina that are an early sign of cancer. This is often found in abnormal results from a Pap smear (Pap test).  This procedure is done in either a health care provider's office or an operating room. Tell a health care provider about:  Any allergies you have.  All medicines you are taking, including vitamins, herbs, eye drops, creams, and over-the-counter medicines.  Any problems you or family members have had with anesthetic medicines.  Any blood disorders you have.  Any surgeries you have had.  Any medical conditions you have.  Whether you are pregnant or may be pregnant. What are the risks? Generally, this is a safe procedure. However, problems may occur, including:  Bleeding.  Infection.  Allergic reactions to medicines, dyes, or other solutions.  Damage to nearby structures or organs. What happens before the procedure?  Ask your health care provider about changing or stopping your regular medicines. This is especially important if you are taking diabetes medicines or blood thinners.  For at least 24 hours before the procedure: ? Do not have sex. ? Do not douche. ? Do not put anything in the vagina, including tampons, vaginal creams, or medicines.  You may be  asked to empty your bladder and bowel right before the procedure. What happens during the procedure?  You will lie on your back with your feet raised in footrests (stirrups).  A lubricated device called a speculum will be put into your vagina to hold it open. This helps your health care provider to better see the inside of the vagina and cervix.  You will be given a medicine to numb the area (local anesthetic).   A magnifying device called a colposcope will be placed into your vagina. It will shine a light and allow your health care provider to see your vagina and cervix more closely.  A solution will be swabbed on your cervix to help your health care provider see the tissue.  A thin tube called an endoscope will be inserted into your vagina. The endoscope will have a laser.  The health care provider will use the laser to shrink, destroy, or remove the tissue.  A cotton swab soaked in solution may be used to remove any burned or destroyed tissue.  A paste may be applied to your cervix to stop bleeding. The procedure may vary among health care providers and hospitals. What happens after the procedure?  You may feel some pain or cramping for a few days.  You may have some bleeding and brownish discharge. You will wear sanitary pads for this.  You may be asked to limit your activities for some time. Summary  Laser surgery is a procedure that uses a focused beam of light called a laser  to shrink, destroy, or remove tissue from the vagina or cervix.  You may have this procedure if you have abnormal cells in your vagina or on your cervix that are an early sign of cancer, which may be found in abnormal results from a Pap smear. You may also have this procedure if tissue from your cervix is to be tested for signs of disease.  Generally, this is a safe procedure. However, problems may occur.  Do not have sex, douche, or put anything in the vagina, including tampons, vaginal creams, or  medicines, for at least 24 hours before the procedure. This information is not intended to replace advice given to you by your health care provider. Make sure you discuss any questions you have with your health care provider. Document Revised: 06/18/2018 Document Reviewed: 06/18/2018 Elsevier Patient Education  [place].

## 2019-05-29 ENCOUNTER — Encounter (HOSPITAL_BASED_OUTPATIENT_CLINIC_OR_DEPARTMENT_OTHER): Payer: Self-pay | Admitting: Gynecologic Oncology

## 2019-05-29 ENCOUNTER — Other Ambulatory Visit: Payer: Self-pay

## 2019-05-29 NOTE — Progress Notes (Signed)
Spoke w/ via phone for pre-op interview---patient Lab needs dos----  I stat 8, ekg            COVID test ------06-03-2019@1005  am Arrive at -------1015 am 06-05-2019 No food after midnight, clear liquids until 915 am then npo Medications to take morning of surgery -----omeprazole Diabetic medication -----n/a Patient Special Instructions -----none Pre-Op special Istructions -----none Patient verbalized understanding of instructions that were given at this phone interview. Patient denies shortness of breath, chest pain, fever, cough a this phone interview.

## 2019-06-03 ENCOUNTER — Other Ambulatory Visit (HOSPITAL_COMMUNITY)
Admission: RE | Admit: 2019-06-03 | Discharge: 2019-06-03 | Disposition: A | Discharge status: Home / Self Care | Payer: 59 | Source: Ambulatory Visit | Attending: Gynecologic Oncology | Admitting: Gynecologic Oncology

## 2019-06-03 DIAGNOSIS — Z20822 Contact with and (suspected) exposure to covid-19: Secondary | ICD-10-CM | POA: Diagnosis not present

## 2019-06-03 DIAGNOSIS — Z01812 Encounter for preprocedural laboratory examination: Secondary | ICD-10-CM | POA: Diagnosis not present

## 2019-06-03 LAB — SARS CORONAVIRUS 2 (TAT 6-24 HRS): SARS Coronavirus 2: NEGATIVE

## 2019-06-04 ENCOUNTER — Telehealth: Payer: Self-pay

## 2019-06-04 NOTE — Anesthesia Preprocedure Evaluation (Addendum)
Anesthesia Evaluation  Patient identified by MRN, date of birth, ID band Patient awake    Reviewed: Allergy & Precautions, NPO status , Patient's Chart, lab work & pertinent test results  Airway Mallampati: II  TM Distance: >3 FB Neck ROM: Full    Dental no notable dental hx. (+) Teeth Intact, Dental Advisory Given   Pulmonary neg pulmonary ROS,    Pulmonary exam normal breath sounds clear to auscultation       Cardiovascular hypertension, Pt. on medications Normal cardiovascular exam Rhythm:Regular Rate:Normal     Neuro/Psych  Headaches, negative psych ROS   GI/Hepatic Neg liver ROS, GERD  ,  Endo/Other  negative endocrine ROS  Renal/GU negative Renal ROS     Musculoskeletal negative musculoskeletal ROS (+)   Abdominal   Peds  Hematology negative hematology ROS (+)   Anesthesia Other Findings   Reproductive/Obstetrics negative OB ROS                            Anesthesia Physical Anesthesia Plan  ASA: III  Anesthesia Plan: General   Post-op Pain Management:    Induction: Intravenous  PONV Risk Score and Plan: 4 or greater and Midazolam, Ondansetron, Dexamethasone and Treatment may vary due to age or medical condition  Airway Management Planned: LMA  Additional Equipment: None  Intra-op Plan:   Post-operative Plan:   Informed Consent: I have reviewed the patients History and Physical, chart, labs and discussed the procedure including the risks, benefits and alternatives for the proposed anesthesia with the patient or authorized representative who has indicated his/her understanding and acceptance.     Dental advisory given  Plan Discussed with: CRNA  Anesthesia Plan Comments:        Anesthesia Quick Evaluation

## 2019-06-04 NOTE — Telephone Encounter (Signed)
Joanne Ibarra called the office to review pre op instructions.  Review done with patient.

## 2019-06-04 NOTE — Telephone Encounter (Signed)
LM for Ms Joanne Ibarra to call the office at [phone] if she has any questions or concerns regarding her pre op instructions for her surgery tomorrow.

## 2019-06-05 ENCOUNTER — Ambulatory Visit (HOSPITAL_BASED_OUTPATIENT_CLINIC_OR_DEPARTMENT_OTHER): Payer: 59 | Admitting: Anesthesiology

## 2019-06-05 ENCOUNTER — Encounter (HOSPITAL_BASED_OUTPATIENT_CLINIC_OR_DEPARTMENT_OTHER): Payer: Self-pay | Admitting: Gynecologic Oncology

## 2019-06-05 ENCOUNTER — Ambulatory Visit (HOSPITAL_BASED_OUTPATIENT_CLINIC_OR_DEPARTMENT_OTHER)
Admission: RE | Admit: 2019-06-05 | Discharge: 2019-06-05 | Disposition: A | Discharge status: Home / Self Care | Payer: 59 | Attending: Gynecologic Oncology | Admitting: Gynecologic Oncology

## 2019-06-05 ENCOUNTER — Encounter (HOSPITAL_BASED_OUTPATIENT_CLINIC_OR_DEPARTMENT_OTHER)
Admission: RE | Disposition: A | Discharge status: Home / Self Care | Payer: Self-pay | Source: Home / Self Care | Attending: Gynecologic Oncology

## 2019-06-05 ENCOUNTER — Other Ambulatory Visit: Payer: Self-pay

## 2019-06-05 DIAGNOSIS — N950 Postmenopausal bleeding: Secondary | ICD-10-CM | POA: Insufficient documentation

## 2019-06-05 DIAGNOSIS — Z8249 Family history of ischemic heart disease and other diseases of the circulatory system: Secondary | ICD-10-CM | POA: Diagnosis not present

## 2019-06-05 DIAGNOSIS — I10 Essential (primary) hypertension: Secondary | ICD-10-CM | POA: Insufficient documentation

## 2019-06-05 DIAGNOSIS — N891 Moderate vaginal dysplasia: Secondary | ICD-10-CM | POA: Diagnosis not present

## 2019-06-05 DIAGNOSIS — N879 Dysplasia of cervix uteri, unspecified: Secondary | ICD-10-CM

## 2019-06-05 DIAGNOSIS — K219 Gastro-esophageal reflux disease without esophagitis: Secondary | ICD-10-CM | POA: Insufficient documentation

## 2019-06-05 DIAGNOSIS — N870 Mild cervical dysplasia: Secondary | ICD-10-CM | POA: Insufficient documentation

## 2019-06-05 DIAGNOSIS — M4802 Spinal stenosis, cervical region: Secondary | ICD-10-CM | POA: Insufficient documentation

## 2019-06-05 DIAGNOSIS — Z79899 Other long term (current) drug therapy: Secondary | ICD-10-CM | POA: Insufficient documentation

## 2019-06-05 DIAGNOSIS — N893 Dysplasia of vagina, unspecified: Secondary | ICD-10-CM | POA: Diagnosis not present

## 2019-06-05 HISTORY — DX: Dysplasia of vagina, unspecified: N89.3

## 2019-06-05 HISTORY — PX: CO2 LASER APPLICATION: SHX5778

## 2019-06-05 LAB — POCT I-STAT, CHEM 8
BUN: 20 mg/dL (ref 6–20)
Calcium, Ion: 1.27 mmol/L (ref 1.15–1.40)
Chloride: 103 mmol/L (ref 98–111)
Creatinine, Ser: 0.9 mg/dL (ref 0.44–1.00)
Glucose, Bld: 93 mg/dL (ref 70–99)
HCT: 45 % (ref 36.0–46.0)
Hemoglobin: 15.3 g/dL — ABNORMAL HIGH (ref 12.0–15.0)
Potassium: 3.6 mmol/L (ref 3.5–5.1)
Sodium: 141 mmol/L (ref 135–145)
TCO2: 29 mmol/L (ref 22–32)

## 2019-06-05 SURGERY — CO2 LASER APPLICATION
Anesthesia: General | Site: Vagina

## 2019-06-05 MED ORDER — KETOROLAC TROMETHAMINE 30 MG/ML IJ SOLN: 30.0000 mg | Freq: Once | INTRAMUSCULAR | Status: DC | PRN

## 2019-06-05 MED ORDER — PROPOFOL 10 MG/ML IV BOLUS
INTRAVENOUS | Status: AC
  Filled 2019-06-05: qty 20

## 2019-06-05 MED ORDER — ACETAMINOPHEN 500 MG PO TABS
1000.0000 mg | ORAL_TABLET | ORAL | Status: AC
  Administered 2019-06-05: 1000 mg via ORAL

## 2019-06-05 MED ORDER — LIDOCAINE 2% (20 MG/ML) 5 ML SYRINGE
INTRAMUSCULAR | Status: DC | PRN
  Administered 2019-06-05: 100 mg via INTRAVENOUS

## 2019-06-05 MED ORDER — ACETAMINOPHEN 500 MG PO TABS
ORAL_TABLET | ORAL | Status: AC
  Filled 2019-06-05: qty 2

## 2019-06-05 MED ORDER — GABAPENTIN 300 MG PO CAPS
ORAL_CAPSULE | ORAL | Status: AC
  Filled 2019-06-05: qty 1

## 2019-06-05 MED ORDER — CELECOXIB 200 MG PO CAPS
ORAL_CAPSULE | ORAL | Status: AC
  Filled 2019-06-05: qty 2

## 2019-06-05 MED ORDER — LIDOCAINE 2% (20 MG/ML) 5 ML SYRINGE
INTRAMUSCULAR | Status: AC
  Filled 2019-06-05: qty 5

## 2019-06-05 MED ORDER — ONDANSETRON HCL 4 MG/2ML IJ SOLN
INTRAMUSCULAR | Status: AC
  Filled 2019-06-05: qty 2

## 2019-06-05 MED ORDER — SCOPOLAMINE 1 MG/3DAYS TD PT72
1.0000 | MEDICATED_PATCH | TRANSDERMAL | Status: DC
  Administered 2019-06-05: 1.5 mg via TRANSDERMAL

## 2019-06-05 MED ORDER — ONDANSETRON HCL 4 MG/2ML IJ SOLN: 4.0000 mg | Freq: Once | INTRAMUSCULAR | Status: DC | PRN

## 2019-06-05 MED ORDER — IODINE STRONG (LUGOLS) 5 % PO SOLN
ORAL | Status: DC | PRN
  Administered 2019-06-05: 0.2 mL

## 2019-06-05 MED ORDER — MIDAZOLAM HCL 2 MG/2ML IJ SOLN
INTRAMUSCULAR | Status: AC
  Filled 2019-06-05: qty 2

## 2019-06-05 MED ORDER — EPHEDRINE SULFATE-NACL 50-0.9 MG/10ML-% IV SOSY
PREFILLED_SYRINGE | INTRAVENOUS | Status: DC | PRN
  Administered 2019-06-05: 10 mg via INTRAVENOUS

## 2019-06-05 MED ORDER — OXYCODONE HCL 5 MG PO TABS: 5.0000 mg | ORAL_TABLET | Freq: Once | ORAL | Status: DC | PRN

## 2019-06-05 MED ORDER — SCOPOLAMINE 1 MG/3DAYS TD PT72
MEDICATED_PATCH | TRANSDERMAL | Status: AC
  Filled 2019-06-05: qty 1

## 2019-06-05 MED ORDER — DEXAMETHASONE SODIUM PHOSPHATE 10 MG/ML IJ SOLN
INTRAMUSCULAR | Status: DC | PRN
  Administered 2019-06-05: 10 mg via INTRAVENOUS

## 2019-06-05 MED ORDER — IBUPROFEN 800 MG PO TABS
800.0000 mg | ORAL_TABLET | Freq: Three times a day (TID) | ORAL | 0 refills | Status: DC | PRN
Start: 2019-06-05 — End: 2020-01-06

## 2019-06-05 MED ORDER — OXYCODONE HCL 5 MG/5ML PO SOLN: 5.0000 mg | Freq: Once | ORAL | Status: DC | PRN

## 2019-06-05 MED ORDER — MIDAZOLAM HCL 5 MG/5ML IJ SOLN
INTRAMUSCULAR | Status: DC | PRN
  Administered 2019-06-05: 2 mg via INTRAVENOUS

## 2019-06-05 MED ORDER — ONDANSETRON HCL 4 MG/2ML IJ SOLN
INTRAMUSCULAR | Status: DC | PRN
  Administered 2019-06-05: 4 mg via INTRAVENOUS

## 2019-06-05 MED ORDER — FENTANYL CITRATE (PF) 100 MCG/2ML IJ SOLN
INTRAMUSCULAR | Status: AC
  Filled 2019-06-05: qty 2

## 2019-06-05 MED ORDER — PROPOFOL 10 MG/ML IV BOLUS
INTRAVENOUS | Status: DC | PRN
  Administered 2019-06-05: 150 mg via INTRAVENOUS

## 2019-06-05 MED ORDER — OXYCODONE HCL 5 MG PO TABS: 5.0000 mg | ORAL_TABLET | ORAL | Status: DC | PRN

## 2019-06-05 MED ORDER — ONDANSETRON HCL 4 MG/2ML IJ SOLN: 4.0000 mg | Freq: Four times a day (QID) | INTRAMUSCULAR | Status: DC | PRN

## 2019-06-05 MED ORDER — DEXAMETHASONE SODIUM PHOSPHATE 10 MG/ML IJ SOLN: 4.0000 mg | INTRAMUSCULAR | Status: DC

## 2019-06-05 MED ORDER — DEXAMETHASONE SODIUM PHOSPHATE 10 MG/ML IJ SOLN
INTRAMUSCULAR | Status: AC
  Filled 2019-06-05: qty 1

## 2019-06-05 MED ORDER — TRAMADOL HCL 50 MG PO TABS: 50.0000 mg | ORAL_TABLET | Freq: Four times a day (QID) | ORAL | Status: DC | PRN

## 2019-06-05 MED ORDER — LACTATED RINGERS IV SOLN: INTRAVENOUS | Status: DC

## 2019-06-05 MED ORDER — FENTANYL CITRATE (PF) 100 MCG/2ML IJ SOLN
INTRAMUSCULAR | Status: DC | PRN
  Administered 2019-06-05: 25 ug via INTRAVENOUS

## 2019-06-05 MED ORDER — HYDROMORPHONE HCL 1 MG/ML IJ SOLN
INTRAMUSCULAR | Status: AC
  Filled 2019-06-05: qty 1

## 2019-06-05 MED ORDER — HYDROMORPHONE HCL 1 MG/ML IJ SOLN
0.2500 mg | INTRAMUSCULAR | Status: DC | PRN
  Administered 2019-06-05: 0.5 mg via INTRAVENOUS

## 2019-06-05 MED ORDER — CELECOXIB 200 MG PO CAPS
400.0000 mg | ORAL_CAPSULE | ORAL | Status: AC
  Administered 2019-06-05: 400 mg via ORAL

## 2019-06-05 MED ORDER — LIDOCAINE HCL 1 % IJ SOLN
INTRAMUSCULAR | Status: DC | PRN
  Administered 2019-06-05: 19 mL

## 2019-06-05 MED ORDER — EPHEDRINE 5 MG/ML INJ
INTRAVENOUS | Status: AC
  Filled 2019-06-05: qty 10

## 2019-06-05 MED ORDER — ACETIC ACID 5 % SOLN
Status: DC | PRN
  Administered 2019-06-05: 1 via TOPICAL

## 2019-06-05 MED ORDER — ONDANSETRON HCL 4 MG PO TABS: 4.0000 mg | ORAL_TABLET | Freq: Four times a day (QID) | ORAL | Status: DC | PRN

## 2019-06-05 MED ORDER — GABAPENTIN 300 MG PO CAPS
300.0000 mg | ORAL_CAPSULE | ORAL | Status: AC
  Administered 2019-06-05: 300 mg via ORAL

## 2019-06-05 SURGICAL SUPPLY — 23 items
CATH ROBINSON RED A/P 16FR (CATHETERS) ×3 IMPLANT
CNTNR URN SCR LID CUP LEK RST (MISCELLANEOUS) IMPLANT
CONT SPEC 4OZ STRL OR WHT (MISCELLANEOUS) ×3
COVER WAND RF STERILE (DRAPES) ×3 IMPLANT
GAUZE SPONGE 4X4 12PLY STRL LF (GAUZE/BANDAGES/DRESSINGS) ×2 IMPLANT
GLOVE BIO SURGEON STRL SZ 6 (GLOVE) ×3 IMPLANT
GLOVE BIO SURGEON STRL SZ 6.5 (GLOVE) ×2 IMPLANT
GLOVE BIO SURGEON STRL SZ7 (GLOVE) ×2 IMPLANT
GLOVE BIO SURGEONS STRL SZ 6.5 (GLOVE) ×2
GLOVE BIOGEL PI IND STRL 7.5 (GLOVE) IMPLANT
GLOVE BIOGEL PI INDICATOR 7.5 (GLOVE) ×2
GOWN STRL REUS W/ TWL LRG LVL3 (GOWN DISPOSABLE) ×1 IMPLANT
GOWN STRL REUS W/TWL LRG LVL3 (GOWN DISPOSABLE) ×9
KIT TURNOVER CYSTO (KITS) ×3 IMPLANT
MANIFOLD NEPTUNE II (INSTRUMENTS) ×2 IMPLANT
PACK VAGINAL WOMENS (CUSTOM PROCEDURE TRAY) ×3 IMPLANT
PAD PREP 24X48 CUFFED NSTRL (MISCELLANEOUS) ×3 IMPLANT
SWAB OB GYN 8IN STERILE 2PK (MISCELLANEOUS) ×4 IMPLANT
TOWEL OR 17X26 10 PK STRL BLUE (TOWEL DISPOSABLE) ×4 IMPLANT
TUBE CONNECTING 12'X1/4 (SUCTIONS) ×1
TUBE CONNECTING 12X1/4 (SUCTIONS) ×1 IMPLANT
VACUUM HOSE/TUBING 7/8INX6FT (MISCELLANEOUS) ×3 IMPLANT
WATER STERILE IRR 500ML POUR (IV SOLUTION) ×3 IMPLANT

## 2019-06-05 NOTE — Transfer of Care (Signed)
Immediate Anesthesia Transfer of Care Note  Patient: Joanne Ibarra  Procedure(s) Performed: CO2 LASER APPLICATION OF VAGINA (N/A )  Patient Location: PACU  Anesthesia Type:General  Level of Consciousness: sedated  Airway & Oxygen Therapy: Patient Spontanous Breathing and Patient connected to nasal cannula oxygen  Post-op Assessment: Report given to RN and Post -op Vital signs reviewed and stable  Post vital signs: Reviewed and stable  Last Vitals: 129/82, 69, 14, 100% Vitals Value Taken Time  BP    Temp    Pulse    Resp    SpO2      Last Pain:  Vitals:   06/05/19 0957  TempSrc: Oral  PainSc: 0-No pain      Patients Stated Pain Goal: 7 ([id number])  Complications: No apparent anesthesia complications

## 2019-06-05 NOTE — Anesthesia Procedure Notes (Signed)
Procedure Name: LMA Insertion Date/Time: 06/05/2019 10:45 AM Performed by: Bonney Aid, CRNA Pre-anesthesia Checklist: Patient identified, Emergency Drugs available, Suction available and Patient being monitored Patient Re-evaluated:Patient Re-evaluated prior to induction Oxygen Delivery Method: Circle system utilized Preoxygenation: Pre-oxygenation with 100% oxygen Induction Type: IV induction Ventilation: Mask ventilation without difficulty LMA: LMA inserted LMA Size: 4.0 Number of attempts: 1 Airway Equipment and Method: Bite block Placement Confirmation: positive ETCO2 Tube secured with: Tape Dental Injury: Teeth and Oropharynx as per pre-operative assessment

## 2019-06-05 NOTE — Anesthesia Postprocedure Evaluation (Signed)
Anesthesia Post Note  Patient: [name]  Procedure(s) Performed: CO2 LASER APPLICATION OF VAGINA, CERVIX (N/A Vagina )     Patient location during evaluation: PACU Anesthesia Type: General Level of consciousness: awake and alert Pain management: pain level controlled Vital Signs Assessment: post-procedure vital signs reviewed and stable Respiratory status: spontaneous breathing, nonlabored ventilation, respiratory function stable and patient connected to nasal cannula oxygen Cardiovascular status: blood pressure returned to baseline and stable Postop Assessment: no apparent nausea or vomiting Anesthetic complications: no    Last Vitals:  Vitals:   06/05/19 1230 06/05/19 1327  BP: 130/81 (!) 133/57  Pulse: (!) 56 (!) 57  Resp: 14 18  Temp:    SpO2: 97% 98%    Last Pain:  Vitals:   06/05/19 1327  TempSrc:   PainSc: 2                  Barnet Glasgow

## 2019-06-05 NOTE — Interval H&P Note (Signed)
History and Physical Interval Note:  06/05/2019 10:26 AM  Joanne Ibarra  has presented today for surgery, with the diagnosis of VAGINAL DYSPLASIA.  The various methods of treatment have been discussed with the patient and family. After consideration of risks, benefits and other options for treatment, the patient has consented to  Procedure(s): CO2 LASER APPLICATION OF VAGINA (N/A) as a surgical intervention.  The patient's history has been reviewed, patient examined, no change in status, stable for surgery.  I have reviewed the patient's chart and labs.  Questions were answered to the patient's satisfaction.     Lafonda Mosses

## 2019-06-05 NOTE — Op Note (Signed)
PATIENT: Joanne Ibarra DATE: 06/05/19   Preop Diagnosis: Vaginal and cervical dysplasia  Postoperative Diagnosis: same as above  Surgery: CO2 laser of the vagina, cervix  Surgeons:  Jeral Pinch, MD Assistant: Joylene John, NP  Anesthesia: General   Estimated blood loss: 5 ml  IVF:  500 ml   Urine output: n/a   Complications: None apparent  Pathology: none   Operative findings: On EUA, small mobile uterus, cervix flush with the vagina. On application of acetic acid, acetowhite changes difficult to appreciate. Lugols applied with decreased uptake on the posterior aspect of the cervix and an approximately 2-3cm area around the cervix on the vagina (from 6-9 o'clock).   Procedure: The patient was identified in the preoperative holding area. Informed consent was signed on the chart. Patient was seen history was reviewed and exam was performed.   The patient was then taken to the operating room and placed in the supine position with SCD hose on. General anesthesia was then induced without difficulty. She was then placed in the dorsolithotomy position. The perineum was prepped with Hibiclens. The vagina was prepped with Hibiclens. The patient was then draped after the prep was dried.   Timeout was performed the patient, procedure, antibiotic, allergy, and length of procedure. 5% acetic acid solution was applied to the cervix and upper vagina after the speculum was placed. The vaginal tissues were inspected for areas of acetowhite changes or leukoplakia. The lesion was difficult to identify, so lugols was placed with findings noted above. The subcuticular tissues were infiltrated with 1% lidocaine.   The patient's surgical field was draped with wet towels. The staff and patient ensured laser-safe eyewear and masks were fitted. The laser was set to 8 watts continuous. The laser was tested for accuracy on a tongue depressor.  The laser was applied to the previously noted  area of the cervix and vagina. The tissue was ablated to the desired depth and the eschar was removed with a moistened sponge. When the entire lesion had been ablated the procedure was complete. Lugols was applied at two additional times to assure complete ablation of the site.  All instrument, suture, laparotomy, Ray-Tec, and needle counts were correct x2. The patient tolerated the procedure well and was taken recovery room in stable condition.   Jeral Pinch MD Gynecologic Oncology

## 2019-06-05 NOTE — Discharge Instructions (Addendum)
06/05/2019  Return to work: [id number] hours if applicable  Activity: 1. Be up and out of the bed during the day.  Take a nap if needed.  You may walk up steps but be careful and use the hand rail.    2. No driving for 48 hours after surgery.    4. Shower daily. No tub baths until cleared by your surgeon.   5. No sexual activity and nothing in the vagina for 4 weeks.  6. You may experience vaginal spotting or vaginal discharge after surgery. The spotting is normal but if you experience heavy bleeding, call our office.  8. Take Tylenol or ibuprofen for pain. Monitor your Tylenol intake to a max of 4,000 mg in a 24 hour period.  Diet: 1. Low sodium Heart Healthy Diet is recommended.  2. It is safe to use a laxative, such as Miralax or Colace, if you have difficulty moving your bowels.   Reasons to call the Doctor:  Fever - Oral temperature greater than 100.4 degrees Fahrenheit  Foul-smelling vaginal discharge  Difficulty urinating  Nausea and vomiting  Difficulty breathing with or without chest pain  New calf pain especially if only on one side  Sudden, continuing increased vaginal bleeding with or without clots.   Contacts: For questions or concerns you should contact:  Dr. Jeral Pinch at [phone]  Joylene John, NP at [phone]  After Hours: call [phone] and have the GYN Oncologist paged/contacted      Post Anesthesia Home Care Instructions  Activity: Get plenty of rest for the remainder of the day. A responsible individual must stay with you for 24 hours following the procedure.  For the next 24 hours, DO NOT: -Drive a car -Paediatric nurse -Drink alcoholic beverages -Take any medication unless instructed by your physician -Make any legal decisions or sign important papers.  Meals: Start with liquid foods such as gelatin or soup. Progress to regular foods as tolerated. Avoid greasy, spicy, heavy foods. If nausea and/or vomiting occur, drink  only clear liquids until the nausea and/or vomiting subsides. Call your physician if vomiting continues.  Special Instructions/Symptoms: Your throat may feel dry or sore from the anesthesia or the breathing tube placed in your throat during surgery. If this causes discomfort, gargle with warm salt water. The discomfort should disappear within 24 hours.  If you had a scopolamine patch placed behind your ear for the management of post- operative nausea and/or vomiting:  1. The medication in the patch is effective for 72 hours, after which it should be removed.  Wrap patch in a tissue and discard in the trash. Wash hands thoroughly with soap and water. 2. You may remove the patch earlier than 72 hours if you experience unpleasant side effects which may include dry mouth, dizziness or visual disturbances. 3. Avoid touching the patch. Wash your hands with soap and water after contact with the patch.

## 2019-06-06 ENCOUNTER — Telehealth: Payer: Self-pay

## 2019-06-06 NOTE — Telephone Encounter (Signed)
Joanne Ibarra states that she is eating,drinking,and urinating well. She has had a good BM as passing gas as well. Afebrile. Pain controlled with ibuprofen. She is aware of her post op appointment and the office number [phone] tto call if she has any questions or concerns.

## 2019-06-19 ENCOUNTER — Other Ambulatory Visit: Payer: Self-pay | Admitting: *Deleted

## 2019-06-19 NOTE — Patient Outreach (Signed)
[place] [facility]) Care Management  06/19/2019  DEVORIA SCHEIER [birth year] [id number]   Subjective: Telephone call to patient's home number, message states call can not be completed as dial, verified number, and unable to leave a message.   Telephone call to patient's mobile number, no answer, left HIPAA compliant voicemail message, and requested call back.   Objective: Per KPN (Knowledge Performance Now, point of care tool) and chart review, patient has had no recent hospitalizations or ED visits.  Patient had outpatient surgery on 06/05/2019, for Vaginal and cervical dysplasia, status post  CO2 laser of the vagina, cervix.    Patient also has a history of hypertension, and  migraines.    Assessment: Received [employer] Hypertension Initiative referral on 05/14/2019.    Referral source: [employer].   Referral reason: " As a benefit of your [employer] plan, you have been chosen to participate in a blood pressure program. You will receive a blood pressure monitor in the mail with information to help you better manage your health."    Screening  follow up pending patient contact.     Plan: RNCM will send unsuccessful outreach  letter, [facility] pamphlet, will call patient for 2nd telephone outreach attempt within 4 business days, screening follow up, and will proceed with case closure within 10 business days if no return call, after 4th unsuccessful outreach call.     Isaly Fasching H. Annia Friendly, BSN, [place] Management [facility] Telephonic CM Phone: [phone] Fax: [phone]

## 2019-06-21 ENCOUNTER — Other Ambulatory Visit: Payer: Self-pay | Admitting: *Deleted

## 2019-06-21 NOTE — Patient Outreach (Signed)
[place] [facility]) Care Management  06/21/2019  Joanne Ibarra Mar 16, 1965 [id number]   Subjective: Telephone call to patient's home number, message states call can not be completed as dial, verified number, and unable to leave a message.   Telephone call to patient's mobile number, no answer, left HIPAA compliant voicemail message, and requested call back.   Objective: Per KPN (Knowledge Performance Now, point of care tool) and chart review, patient has had no recent hospitalizations or ED visits.  Patient had outpatient surgery on 06/05/2019, for Vaginal and cervical dysplasia, status post  CO2 laser of thevagina, cervix.    Patient also has a history of hypertension, and  migraines.    Assessment: Received [employer] Hypertension Initiative referral on 05/14/2019.    Referral source: [employer].   Referral reason:  As a benefit of your [employer] plan, you have been chosen to participate in a blood pressure program. You will receive a blood pressure monitor in the mail with information to help you better manage your health.    Screening  follow up pending patient contact.     Plan: RNCM will send unsuccessful outreach  letter, [facility] pamphlet, will call patient for 2nd telephone outreach attempt within 4 business days, screening follow up, and will proceed with case closure within 10 business days if no return call, after 4th unsuccessful outreach call.     Joanne Ibarra H. Annia Friendly, BSN, [place] Management [facility] Telephonic CM Phone: [phone] Fax: [phone]

## 2019-06-27 ENCOUNTER — Other Ambulatory Visit: Payer: Self-pay | Admitting: *Deleted

## 2019-06-27 NOTE — Patient Outreach (Signed)
[facility]) Care Management  06/27/2019  ERRIN WHITELAW 1965/05/13 [id number]   Subjective: Telephone call to patient's mobile number,no answer, left HIPAA compliant voicemail message, and requested call back.   Objective:Per KPN (Knowledge Performance Now, point of care tool) and chart review,patient has had no recent hospitalizations or ED visits. Patient had outpatient surgery on 06/05/2019, for Vaginal and cervical dysplasia, status postCO2 laser of thevagina, cervix. Patient also has a history of hypertension, and migraines.    Assessment: Received [employer] Hypertension Initiative referral on 05/14/2019. Referral source: [employer]. Referral reason: "As a benefit of your [employer] plan, you have been chosen to participate in a blood pressure program. You will receive a blood pressure monitor in the mail with information to help you better manage your health."Screening follow up pending patient contact.     Plan:RNCM has sent unsuccessful outreach letter, [facility] pamphlet, will call patient for 4th telephone outreach attempt within 21 business days, screening follow up, and will proceed with case closure within 10 business days if no return call, after 4th unsuccessful outreach call.     Seleena Reimers H. Annia Friendly, BSN, [place] Management [facility] Telephonic CM Phone: [phone] Fax: [phone]

## 2019-07-03 ENCOUNTER — Other Ambulatory Visit: Payer: Self-pay | Admitting: *Deleted

## 2019-07-03 NOTE — Patient Outreach (Signed)
[place] [facility]) Care Management  07/03/2019  Joanne Ibarra 1965-04-08 [id number]   Subjective: Received voicemail message from [facility], states she is returning call, and requested call back. Telephone call to patient's mobile number, no answer, left HIPAA compliant voicemail message, and requested call back. Telephone call from patient's mobile number, spoke with patient, and HIPAA verified.  Discussed [place] Hypertension Initiative program referral follow up, patient voiced understanding, and is in agreement to follow up.  Patient states she no longer has [employer] as of 06/28/2019 due to a job change, and is aware that she is not eligible for program. States she is very appreciative of the follow up.    Objective:Per KPN (Knowledge Performance Now, point of care tool) and chart review,patient has had no recent hospitalizations or ED visits. Patient had outpatient surgery on 06/05/2019, for Vaginal and cervical dysplasia, status postCO2 laser of thevagina, cervix. Patient also has a history of hypertension, and migraines.    Assessment: Received [employer] Hypertension Initiative referral on 05/14/2019. Referral source: [employer]. Referral reason: "As a benefit of your [employer] plan, you have been chosen to participate in a blood pressure program. You will receive a blood pressure monitor in the mail with information to help you better manage your health."Screening follow up not completed due to patient not eligible for program, ineligible payer.  Will proceed with case closure.    Plan:Case closure due to patient dis-enrolled from coverage, ineligible payer.   Joanne Ibarra, BSN, [place] Management [facility] Telephonic CM Phone: [phone] Fax: [phone]

## 2019-07-15 ENCOUNTER — Encounter: Payer: Self-pay | Admitting: Gynecologic Oncology

## 2019-07-15 ENCOUNTER — Other Ambulatory Visit: Payer: Self-pay

## 2019-07-15 ENCOUNTER — Inpatient Hospital Stay: Payer: 59 | Attending: Gynecologic Oncology | Admitting: Gynecologic Oncology

## 2019-07-15 VITALS — BP 122/78 | HR 73 | Temp 98.5°F | Resp 16 | Ht 64.0 in | Wt 166.5 lb

## 2019-07-15 DIAGNOSIS — N891 Moderate vaginal dysplasia: Secondary | ICD-10-CM

## 2019-07-15 DIAGNOSIS — N893 Dysplasia of vagina, unspecified: Secondary | ICD-10-CM | POA: Insufficient documentation

## 2019-07-15 DIAGNOSIS — N870 Mild cervical dysplasia: Secondary | ICD-10-CM | POA: Insufficient documentation

## 2019-07-15 NOTE — Progress Notes (Signed)
Gynecologic Oncology Return Clinic Visit  07/15/19  Reason for Visit: post-op follow-up and treatment recommendations  Treatment History: 2005/2006: cervix dysplasia - ultimately underwent LEEP Normal paps until 2017 2017: presented with PMB as well as recurrent CIN-1 and underwent endometrial and endocervical curettings and a LEEP.  Endometrial curetting showed weakly proliferative endometrium.  Endocervical curetting showed CIN1.  Her LEEP was notable for CIN-1 with dysplasia focally involving the endocervical and ectocervical margins.  A top hat appears to have been performed that was negative for dysplasia or malignancy.   She had frequent Pap testing afterwards that was normal until January 2020.     01/2018: Pap - LSIL, HPV positive. 04/05/19: with several new vulvar and perineal lesions with associated pruritus, no pain. On exam, these were described as small papules with umbilicated center. Given vesicular appearance of some lesions, HSV culture was obtained, which was negative.  04/18/19: Pap - LSIL, HPV positive 05/07/19: colposcopy - not satisfactory given cervical stenosis and inability to see a transformational zone given prior cervical procedures.  No biopsy of the cervix was taken.  There were several vaginal lesions with acetowhite changes, 1 of which was biopsied and revealed moderate dysplasia. 06/05/19: CO2 laser ablation of the vagina, cervix  Interval History: Patient reports overall doing well since surgery.  She denies any discharge although has had some very minimal spotting after intercourse the last 2 weekends.  She notes that this lasted for half a day and was notable on the toilet paper when she wiped to go to the bathroom.  Only once did this require wearing a pad or panty liner.  She also endorses some burning with intercourse.  She notes a good appetite without any nausea or vomiting.  She denies any fevers or chills.  She reports normal bowel function.  She has had some  increased urinary frequency but denies any other urinary symptoms.  Past Medical/Surgical History: Past Medical History:  Diagnosis Date  . Allergy    seasonal allergies.  Marland Kitchen Dysrhythmia    PALPITATIONS YEARS AGO-NO CURRENT PROBLEMS  . GERD (gastroesophageal reflux disease)    occasional.  . Headache    H/O MIGRAINES  . Hypertension   . Vaginal dysplasia     Past Surgical History:  Procedure Laterality Date  . CERVICAL BIOPSY  W/ LOOP ELECTRODE EXCISION  2003  . CO2 LASER APPLICATION N/A 8/41/3244   Procedure: CO2 LASER APPLICATION OF VAGINA, CERVIX;  Surgeon: Lafonda Mosses, MD;  Location: [facility];  Service: Gynecology;  Laterality: N/A;  . DILATATION & CURETTAGE/HYSTEROSCOPY WITH MYOSURE N/A 06/19/2015   Procedure: DILATATION & CURETTAGE/ HYSTEROSCOPY ;  Surgeon: Gae Dry, MD;  Location: [facility];  Service: Gynecology;  Laterality: N/A;  . LEEP N/A 06/19/2015   Procedure: LOOP ELECTROSURGICAL EXCISION PROCEDURE (LEEP);  Surgeon: Gae Dry, MD;  Location: [facility];  Service: Gynecology;  Laterality: N/A;  . lensectomy Bilateral 2016   for scar tissue build up    Family History  Problem Relation Age of Onset  . Asthma Sister   . Food Allergy Sister   . Asthma Daughter   . Diabetes Mother   . Hypertension Mother   . Cancer Mother        melonoma of vulva  . Cancer Father        melanoma  . Breast cancer Cousin   . Allergic rhinitis Neg Hx   . Angioedema Neg Hx   . Eczema Neg Hx   .  Immunodeficiency Neg Hx   . Urticaria Neg Hx   . Colon cancer Neg Hx   . Esophageal cancer Neg Hx   . Stomach cancer Neg Hx   . Rectal cancer Neg Hx     Social History   Socioeconomic History  . Marital status: Legally Separated    Spouse name: Not on file  . Number of children: Not on file  . Years of education: Not on file  . Highest education level: Not on file  Occupational History  . Not on file  Tobacco Use  . Smoking status: Never Smoker   . Smokeless tobacco: Never Used  Vaping Use  . Vaping Use: Never used  Substance and Sexual Activity  . Alcohol use: Yes    Comment: social  . Drug use: No  . Sexual activity: Yes  Other Topics Concern  . Not on file  Social History Narrative  . Not on file   Social Determinants of Health   Financial Resource Strain:   . Difficulty of Paying Living Expenses:   Food Insecurity:   . Worried About Charity fundraiser in the Last Year:   . Arboriculturist in the Last Year:   Transportation Needs:   . Film/video editor (Medical):   Marland Kitchen Lack of Transportation (Non-Medical):   Physical Activity:   . Days of Exercise per Week:   . Minutes of Exercise per Session:   Stress:   . Feeling of Stress :   Social Connections:   . Frequency of Communication with Friends and Family:   . Frequency of Social Gatherings with Friends and Family:   . Attends Religious Services:   . Active Member of Clubs or Organizations:   . Attends Archivist Meetings:   Marland Kitchen Marital Status:     Current Medications:  Current Outpatient Medications:  .  fluticasone (FLONASE) 50 MCG/ACT nasal spray, SPRAY 2 SPRAYS INTO EACH NOSTRIL EVERY DAY (Patient taking differently: as needed. ), Disp: 16 mL, Rfl: 5 .  hydrochlorothiazide (HYDRODIURIL) 25 MG tablet, TAKE 1 TABLET BY MOUTH EVERY DAY, Disp: 30 tablet, Rfl: 11 .  ibuprofen (ADVIL) 800 MG tablet, Take 1 tablet (800 mg total) by mouth every 8 (eight) hours as needed for moderate pain., Disp: 30 tablet, Rfl: 0 .  omeprazole (PRILOSEC) 20 MG capsule, Take 20 mg by mouth daily as needed. , Disp: , Rfl:  .  UNABLE TO FIND, Med Name: magni calium d 3 tabs 1 time day, Disp: , Rfl:   Review of Systems: Pertinent positives as per HPI Denies appetite changes, fevers, chills, fatigue, unexplained weight changes. Denies hearing loss, neck lumps or masses, mouth sores, ringing in ears or voice changes. Denies cough or wheezing.  Denies shortness of  breath. Denies chest pain or palpitations. Denies leg swelling. Denies abdominal distention, pain, blood in stools, constipation, diarrhea, nausea, vomiting, or early satiety. Denies pain with dysuria, hematuria or incontinence. Denies hot flashes, pelvic pain, or vaginal discharge.   Denies joint pain, back pain or muscle pain/cramps. Denies itching, rash, or wounds. Denies dizziness, headaches, numbness or seizures. Denies swollen lymph nodes or glands, denies easy bruising or bleeding. Denies anxiety, depression, confusion, or decreased concentration.  Physical Exam: BP 122/78 (BP Location: Left Arm, Patient Position: Sitting)   Pulse 73   Temp 98.5 F (36.9 C) (Oral)   Resp 16   Ht 5\' 4"  (1.626 m)   Wt 166 lb 8 oz (75.5 kg)   LMP  09/28/2013   SpO2 100%   BMI 28.58 kg/m  General: Alert, oriented, no acute distress. HEENT: Normocephalic, atraumatic, sclera anicteric. Chest: Unlabored breathing on room air. Extremities: Grossly normal range of motion.  Warm, well perfused.  No edema bilaterally. GU: Normal appearing external genitalia without erythema, excoriation, or lesions.  Speculum exam reveals no blood within the vault or discharge.  There is still some evidence of healing vaginal tissue approximately 1 cm from the cervix between 7 and 9:00 that spans 2 cm.  Silver nitrate was used to treat this area as suspected source of vaginal spotting.  No other lesions apparent, tissue overall healing well.  No masses or nodularity felt on bimanual exam.  Laboratory & Radiologic Studies: None new  Assessment & Plan: Joanne Ibarra is a 54 y.o. 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  Patient is overall healing well from surgery.  Silver nitrate was used on a still healing area on the right apical wall of the vagina.  I have asked the patient to call if she continues to have any spotting with intercourse over  the next couple of weeks.  We discussed that her low-grade cervical dysplasia should continue to be followed (would be appropriate to repeat cotesting in 1 year from her low-grade Pap test) and that I would recommend 72-month follow-up vaginoscopy in light of her high-grade vaginal dysplasia.  We also discussed the data that would support either treatment alone with vaginal estrogen or in combination with another therapy, such as laser.  I have recommended that the patient start vaginal estrogen.  She is in the process of changing jobs and will not have insurance benefits for 2 months.  I have asked her to call back after that time and we will send in a prescription for vaginal estrogen.  My recommendations will be communicated to her gynecologist, Dr. Gaetano Net.  22 minutes of total time was spent for this patient encounter, including preparation, face-to-face counseling with the patient and coordination of care, and documentation of the encounter.  Jeral Pinch, MD  Division of Gynecologic Oncology  Department of Obstetrics and Gynecology  [facility]

## 2019-07-15 NOTE — Patient Instructions (Signed)
You are healing well from surgery!  If you have bleeding again after intercourse in the next couple of weeks, please give me a call.  Otherwise, please call me after your new jobs insurance kicks in and I will send a prescription for vaginal estrogen for you to use.  I will notify Dr. Gaetano Net, but I recommend that you be seen for vaginoscopy in 6 months.

## 2019-07-19 ENCOUNTER — Ambulatory Visit: Payer: Self-pay | Admitting: *Deleted

## 2019-09-11 ENCOUNTER — Telehealth: Payer: Self-pay | Admitting: Oncology

## 2019-09-11 NOTE — Telephone Encounter (Signed)
Called Dr. Sherlynn Stalls office and they did not received Dr. Charisse March office note from 07/15/19.  Faxed note to [phone].  Will follow up to make sure 6 month follow up is scheduled.

## 2019-11-27 ENCOUNTER — Ambulatory Visit: Payer: Self-pay | Admitting: Medical

## 2019-11-27 ENCOUNTER — Other Ambulatory Visit: Payer: Self-pay

## 2019-11-27 VITALS — BP 116/72 | HR 62 | Temp 98.6°F | Resp 18 | Ht 64.0 in | Wt 167.6 lb

## 2019-11-27 DIAGNOSIS — Z1231 Encounter for screening mammogram for malignant neoplasm of breast: Secondary | ICD-10-CM | POA: Diagnosis not present

## 2019-11-27 DIAGNOSIS — I10 Essential (primary) hypertension: Secondary | ICD-10-CM | POA: Diagnosis not present

## 2019-11-27 DIAGNOSIS — D582 Other hemoglobinopathies: Secondary | ICD-10-CM | POA: Diagnosis not present

## 2019-11-27 LAB — LIPID PANEL
Cholesterol: 262 mg/dL — ABNORMAL HIGH (ref 0–200)
HDL: 73.4 mg/dL (ref >39.00)
LDL Cholesterol: 165 mg/dL — ABNORMAL HIGH (ref 0–99)
NonHDL: 188.57
Total CHOL/HDL Ratio: 4
Triglycerides: 119 mg/dL (ref 0.0–149.0)
VLDL: 23.8 mg/dL (ref 0.0–40.0)

## 2019-11-27 LAB — CBC WITH DIFFERENTIAL/PLATELET
Basophils Absolute: 0.1 10*3/uL (ref 0.0–0.1)
Basophils Relative: 1.3 % (ref 0.0–3.0)
Eosinophils Absolute: 0.1 10*3/uL (ref 0.0–0.7)
Eosinophils Relative: 1.8 % (ref 0.0–5.0)
HCT: 43.9 % (ref 36.0–46.0)
Hemoglobin: 14.8 g/dL (ref 12.0–15.0)
Lymphocytes Relative: 42.9 % (ref 12.0–46.0)
Lymphs Abs: 1.9 10*3/uL (ref 0.7–4.0)
MCHC: 33.7 g/dL (ref 30.0–36.0)
MCV: 90.2 fl (ref 78.0–100.0)
Monocytes Absolute: 0.5 10*3/uL (ref 0.1–1.0)
Monocytes Relative: 11.4 % (ref 3.0–12.0)
Neutro Abs: 1.8 10*3/uL (ref 1.4–7.7)
Neutrophils Relative %: 42.6 % — ABNORMAL LOW (ref 43.0–77.0)
Platelets: 300 10*3/uL (ref 150.0–400.0)
RBC: 4.87 Mil/uL (ref 3.87–5.11)
RDW: 14.3 % (ref 11.5–15.5)
WBC: 4.3 10*3/uL (ref 4.0–10.5)

## 2019-11-27 LAB — COMPREHENSIVE METABOLIC PANEL
ALT: 19 U/L (ref 0–35)
AST: 20 U/L (ref 0–37)
Albumin: 4.3 g/dL (ref 3.5–5.2)
Alkaline Phosphatase: 84 U/L (ref 39–117)
BUN: 16 mg/dL (ref 6–23)
CO2: 33 mEq/L — ABNORMAL HIGH (ref 19–32)
Calcium: 9.6 mg/dL (ref 8.4–10.5)
Chloride: 100 mEq/L (ref 96–112)
Creatinine, Ser: 0.86 mg/dL (ref 0.40–1.20)
GFR: 76.42 mL/min (ref >60.00)
Glucose, Bld: 74 mg/dL (ref 70–99)
Potassium: 4 mEq/L (ref 3.5–5.1)
Sodium: 139 mEq/L (ref 135–145)
Total Bilirubin: 0.5 mg/dL (ref 0.2–1.2)
Total Protein: 7.2 g/dL (ref 6.0–8.3)

## 2019-11-27 NOTE — Progress Notes (Signed)
Subjective:    Patient ID: Joanne Ibarra, female    DOB: 11-22-1965, 54 y.o.   MRN: [id number]  HPI  Pt has htn. Bp well controlled today. She does not check at home. No gross motor or sensory function deficits.  Pt is on hctz. Pt is on potassium.  On prior lab elevated hb.  Pt declines flu vaccine      Review of Systems  Constitutional: Negative for chills, fatigue and fever.  Respiratory: Negative for cough, chest tightness, shortness of breath and wheezing.   Cardiovascular: Negative for chest pain and palpitations.  Gastrointestinal: Negative for abdominal pain.  Neurological: Negative for dizziness, light-headedness and numbness.  Hematological: Negative for adenopathy. Does not bruise/bleed easily.    Past Medical History:  Diagnosis Date  . Allergy    seasonal allergies.  Marland Kitchen Dysrhythmia    PALPITATIONS YEARS AGO-NO CURRENT PROBLEMS  . GERD (gastroesophageal reflux disease)    occasional.  . Headache    H/O MIGRAINES  . Hypertension   . Vaginal dysplasia      Social History   Socioeconomic History  . Marital status: Divorced    Spouse name: Not on file  . Number of children: Not on file  . Years of education: Not on file  . Highest education level: Not on file  Occupational History  . Not on file  Tobacco Use  . Smoking status: Never Smoker  . Smokeless tobacco: Never Used  Vaping Use  . Vaping Use: Never used  Substance and Sexual Activity  . Alcohol use: Yes    Comment: social  . Drug use: No  . Sexual activity: Yes  Other Topics Concern  . Not on file  Social History Narrative  . Not on file   Social Determinants of Health   Financial Resource Strain:   . Difficulty of Paying Living Expenses: Not on file  Food Insecurity:   . Worried About Charity fundraiser in the Last Year: Not on file  . Ran Out of Food in the Last Year: Not on file  Transportation Needs:   . Lack of Transportation (Medical): Not on file  . Lack of  Transportation (Non-Medical): Not on file  Physical Activity:   . Days of Exercise per Week: Not on file  . Minutes of Exercise per Session: Not on file  Stress:   . Feeling of Stress : Not on file  Social Connections:   . Frequency of Communication with Friends and Family: Not on file  . Frequency of Social Gatherings with Friends and Family: Not on file  . Attends Religious Services: Not on file  . Active Member of Clubs or Organizations: Not on file  . Attends Archivist Meetings: Not on file  . Marital Status: Not on file  Intimate Partner Violence:   . Fear of Current or Ex-Partner: Not on file  . Emotionally Abused: Not on file  . Physically Abused: Not on file  . Sexually Abused: Not on file    Past Surgical History:  Procedure Laterality Date  . CERVICAL BIOPSY  W/ LOOP ELECTRODE EXCISION  2003  . CO2 LASER APPLICATION N/A 08/26/1749   Procedure: CO2 LASER APPLICATION OF VAGINA, CERVIX;  Surgeon: Lafonda Mosses, MD;  Location: [facility];  Service: Gynecology;  Laterality: N/A;  . DILATATION & CURETTAGE/HYSTEROSCOPY WITH MYOSURE N/A 06/19/2015   Procedure: DILATATION & CURETTAGE/ HYSTEROSCOPY ;  Surgeon: Gae Dry, MD;  Location: [facility];  Service:  Gynecology;  Laterality: N/A;  . LEEP N/A 06/19/2015   Procedure: LOOP ELECTROSURGICAL EXCISION PROCEDURE (LEEP);  Surgeon: Gae Dry, MD;  Location: [facility];  Service: Gynecology;  Laterality: N/A;  . lensectomy Bilateral 2016   for scar tissue build up    Family History  Problem Relation Age of Onset  . Asthma Sister   . Food Allergy Sister   . Asthma Daughter   . Diabetes Mother   . Hypertension Mother   . Cancer Mother        melonoma of vulva  . Cancer Father        melanoma  . Breast cancer Cousin   . Allergic rhinitis Neg Hx   . Angioedema Neg Hx   . Eczema Neg Hx   . Immunodeficiency Neg Hx   . Urticaria Neg Hx   . Colon cancer Neg Hx   . Esophageal cancer Neg Hx     . Stomach cancer Neg Hx   . Rectal cancer Neg Hx     No Known Allergies  Current Outpatient Medications on File Prior to Visit  Medication Sig Dispense Refill  . fluticasone (FLONASE) 50 MCG/ACT nasal spray SPRAY 2 SPRAYS INTO EACH NOSTRIL EVERY DAY (Patient taking differently: as needed. ) 16 mL 5  . hydrochlorothiazide (HYDRODIURIL) 25 MG tablet TAKE 1 TABLET BY MOUTH EVERY DAY 30 tablet 11  . omeprazole (PRILOSEC) 20 MG capsule Take 20 mg by mouth daily as needed.     Marland Kitchen UNABLE TO FIND Med Name: magni calium d 3 tabs 1 time day    . ibuprofen (ADVIL) 800 MG tablet Take 1 tablet (800 mg total) by mouth every 8 (eight) hours as needed for moderate pain. 30 tablet 0   No current facility-administered medications on file prior to visit.    BP 116/72   Pulse 62   Temp 98.6 F (37 C) (Oral)   Resp 18   Ht 5\' 4"  (1.626 m)   Wt 167 lb 9.6 oz (76 kg)   LMP 09/28/2013   SpO2 98%   BMI 28.77 kg/m       Objective:   Physical Exam  General Mental Status- Alert. General Appearance- Not in acute distress.   Skin General: Color- Normal Color. Moisture- Normal Moisture.  Neck Carotid Arteries- Normal color. Moisture- Normal Moisture. No carotid bruits. No JVD.  Chest and Lung Exam Auscultation: Breath Sounds:-Normal.  Cardiovascular Auscultation:Rythm- Regular. Murmurs & Other Heart Sounds:Auscultation of the heart reveals- No Murmurs.  Abdomen Inspection:-Inspeection Normal. Palpation/Percussion:Note:No mass. Palpation and Percussion of the abdomen reveal- Non Tender, Non Distended + BS, no rebound or guarding.   Neurologic Cranial Nerve exam:- CN III-XII intact(No nystagmus), symmetric smile. Strength:- 5/5 equal and symmetric strength both upper and lower extremities.      Assessment & Plan:  Your bp is well controlled. Continue hctz and potassium. Will check cmp and lipid panel today.  Do recommend that you get relion bp cuff to have on hand to check bp  occasionally.  Hx of mild increased hb. Will get cbc today.  Declined flu vaccine and tdap.  Follow up with gyn for irregular pap.  Follow up date to be determined after lab review.  Mackie Pai, PA-C

## 2019-11-27 NOTE — Patient Instructions (Addendum)
Your bp is well controlled. Continue hctz and potassium. Will check cmp and lipid panel today.  Do recommend that you get relion bp cuff to have on hand to check bp occasionally.  Hx of mild increased hb. Will get cbc today.  Declined flu vaccine and tdap.  Follow up with gyn for irregular pap.  Follow up date to be determined after lab review.

## 2019-11-28 ENCOUNTER — Other Ambulatory Visit: Payer: Self-pay | Admitting: Medical

## 2020-01-06 ENCOUNTER — Other Ambulatory Visit: Payer: Self-pay

## 2020-01-06 ENCOUNTER — Ambulatory Visit: Payer: PRIVATE HEALTH INSURANCE | Admitting: Family Medicine

## 2020-01-06 ENCOUNTER — Encounter: Payer: Self-pay | Admitting: Family Medicine

## 2020-01-06 VITALS — BP 142/102 | HR 97 | Temp 98.0°F | Ht 64.0 in | Wt 169.0 lb

## 2020-01-06 DIAGNOSIS — R0781 Pleurodynia: Secondary | ICD-10-CM | POA: Diagnosis not present

## 2020-01-06 DIAGNOSIS — R109 Unspecified abdominal pain: Secondary | ICD-10-CM

## 2020-01-06 DIAGNOSIS — W19XXXA Unspecified fall, initial encounter: Secondary | ICD-10-CM

## 2020-01-06 MED ORDER — TRAMADOL HCL 50 MG PO TABS: 50.0000 mg | ORAL_TABLET | Freq: Three times a day (TID) | ORAL | 0 refills | Status: AC | PRN

## 2020-01-06 MED ORDER — CYCLOBENZAPRINE HCL 10 MG PO TABS: 5.0000 mg | ORAL_TABLET | Freq: Three times a day (TID) | ORAL | 0 refills | Status: DC | PRN

## 2020-01-06 MED ORDER — MELOXICAM 15 MG PO TABS: 15.0000 mg | ORAL_TABLET | Freq: Every day | ORAL | 0 refills | Status: DC

## 2020-01-06 NOTE — Progress Notes (Signed)
Musculoskeletal Exam  Patient: Joanne Ibarra DOB: [birth year]  DOS: 01/06/2020  SUBJECTIVE:  Chief Complaint:   Chief Complaint  Patient presents with  . [place] is a 54 y.o.  female for evaluation and treatment of abd pain.   Onset:  3 days ago. [place] on a motorcycle toy   Location: abd Character:  aching and spasming  Progression of issue:  has worsened Associated symptoms: bruising; no redness, swelling, fevers, bowel changes, sob, vomiting Treatment: to date has been acetaminophen.   Neurovascular symptoms: no  Past Medical History:  Diagnosis Date  . Allergy    seasonal allergies.  Marland Kitchen Dysrhythmia    PALPITATIONS YEARS AGO-NO CURRENT PROBLEMS  . GERD (gastroesophageal reflux disease)    occasional.  . Headache    H/O MIGRAINES  . Hypertension   . Vaginal dysplasia     Objective: VITAL SIGNS: BP (!) 142/102 (BP Location: Left Arm, Patient Position: Sitting, Cuff Size: Normal)   Pulse 97   Temp 98 F (36.7 C) (Oral)   Ht 5\' 4"  (1.626 m)   Wt 169 lb (76.7 kg)   LMP 09/28/2013   SpO2 96%   BMI 29.01 kg/m  Constitutional: Well formed, well developed. No acute distress. Thorax & Lungs: CTAB. No accessory muscle use Musculoskeletal: Abdomen and right chest wall.   Tenderness to palpation: Yes over the upper abdominal region and right lower rib cage over the anterior axillary line at approximately rib 10 Deformity: no Ecchymosis: yes There is no crepitus There is no abdominal distention or fluid waves Neurologic: Normal sensory function. Psychiatric: Normal mood. Age appropriate judgment and insight. Alert & oriented x 3.    Assessment:  Fall, initial encounter - Plan: traMADol (ULTRAM) 50 MG tablet, cyclobenzaprine (FLEXERIL) 10 MG tablet, meloxicam (MOBIC) 15 MG tablet  Abdominal wall pain - Plan: traMADol (ULTRAM) 50 MG tablet, cyclobenzaprine (FLEXERIL) 10 MG tablet, meloxicam (MOBIC) 15 MG tablet  Rib pain on right side - Plan:  traMADol (ULTRAM) 50 MG tablet, cyclobenzaprine (FLEXERIL) 10 MG tablet, meloxicam (MOBIC) 15 MG tablet  Plan: Stretches/exercises, heat, ice, Tylenol. Warnings about Flexeril and Tramadol verbalized and written down. Mobic daily.  Muscle relaxer in tramadol as needed.  Recommended she does not take both of these at the same time.  Activity as tolerated.   Elevated BP due to pain, monitor in future.  F/u prn. The patient voiced understanding and agreement to the plan.   [place], DO 01/06/20  3:43 PM

## 2020-01-06 NOTE — Patient Instructions (Signed)
Ice/cold pack over area for 10-15 min twice daily.  Heat (pad or rice pillow in microwave) over affected area, 10-15 minutes twice daily.   OK to take Tylenol 1000 mg (2 extra strength tabs) or 975 mg (3 regular strength tabs) every 6 hours as needed.  Take Flexeril (cyclobenzaprine) 1-2 hours before planned bedtime. If it makes you drowsy, do not take during the day. You can try half a tab the following night.  Do not drink alcohol, do any illicit/street drugs, drive or do anything that requires alertness while on the tramadol.   Let us know if you need anything.

## 2020-02-02 ENCOUNTER — Other Ambulatory Visit: Payer: Self-pay | Admitting: Family Medicine

## 2020-02-02 DIAGNOSIS — W19XXXA Unspecified fall, initial encounter: Secondary | ICD-10-CM

## 2020-02-02 DIAGNOSIS — R109 Unspecified abdominal pain: Secondary | ICD-10-CM

## 2020-02-02 DIAGNOSIS — R0781 Pleurodynia: Secondary | ICD-10-CM

## 2020-02-27 ENCOUNTER — Telehealth: Payer: Self-pay | Admitting: Medical

## 2020-02-27 NOTE — Telephone Encounter (Signed)
Medication: hydrochlorothiazide (HYDRODIURIL) 25 MG tablet [[id number]]    Has the patient contacted their pharmacy? NO (If no, request that the patient contact the pharmacy for the refill.) (If yes, when and what did the pharmacy advise?)    Preferred Pharmacy (with phone number or street name):  [id number] - [place], [place] - [address] AT [place] Phone:  [phone]  Fax:  [phone]        Agent: Please be advised that RX refills may take up to 3 business days. We ask that you follow-up with your pharmacy.

## 2020-05-25 ENCOUNTER — Other Ambulatory Visit: Payer: Self-pay | Admitting: Obstetrics and Gynecology

## 2020-05-25 DIAGNOSIS — R928 Other abnormal and inconclusive findings on diagnostic imaging of breast: Secondary | ICD-10-CM

## 2020-05-30 ENCOUNTER — Ambulatory Visit
Admission: RE | Admit: 2020-05-30 | Discharge: 2020-05-30 | Disposition: A | Discharge status: Home / Self Care | Payer: PRIVATE HEALTH INSURANCE | Source: Ambulatory Visit | Attending: Obstetrics and Gynecology | Admitting: Obstetrics and Gynecology

## 2020-05-30 ENCOUNTER — Ambulatory Visit: Payer: PRIVATE HEALTH INSURANCE

## 2020-05-30 ENCOUNTER — Other Ambulatory Visit: Payer: Self-pay

## 2020-05-30 DIAGNOSIS — R928 Other abnormal and inconclusive findings on diagnostic imaging of breast: Secondary | ICD-10-CM

## 2020-09-16 ENCOUNTER — Other Ambulatory Visit: Payer: Self-pay

## 2020-09-16 ENCOUNTER — Ambulatory Visit (INDEPENDENT_AMBULATORY_CARE_PROVIDER_SITE_OTHER): Payer: PRIVATE HEALTH INSURANCE | Admitting: Medical

## 2020-09-16 VITALS — BP 118/80 | HR 67 | Temp 98.2°F | Ht 64.0 in | Wt 181.8 lb

## 2020-09-16 DIAGNOSIS — Z0000 Encounter for general adult medical examination without abnormal findings: Secondary | ICD-10-CM

## 2020-09-16 DIAGNOSIS — R5383 Other fatigue: Secondary | ICD-10-CM | POA: Diagnosis not present

## 2020-09-16 DIAGNOSIS — Z1159 Encounter for screening for other viral diseases: Secondary | ICD-10-CM | POA: Diagnosis not present

## 2020-09-16 DIAGNOSIS — Z23 Encounter for immunization: Secondary | ICD-10-CM

## 2020-09-16 DIAGNOSIS — M25571 Pain in right ankle and joints of right foot: Secondary | ICD-10-CM | POA: Diagnosis not present

## 2020-09-16 LAB — COMPREHENSIVE METABOLIC PANEL
ALT: 24 U/L (ref 0–35)
AST: 24 U/L (ref 0–37)
Albumin: 4.2 g/dL (ref 3.5–5.2)
Alkaline Phosphatase: 76 U/L (ref 39–117)
BUN: 17 mg/dL (ref 6–23)
CO2: 30 mEq/L (ref 19–32)
Calcium: 9.8 mg/dL (ref 8.4–10.5)
Chloride: 100 mEq/L (ref 96–112)
Creatinine, Ser: 0.89 mg/dL (ref 0.40–1.20)
GFR: 72.92 mL/min (ref >60.00)
Glucose, Bld: 89 mg/dL (ref 70–99)
Potassium: 3.7 mEq/L (ref 3.5–5.1)
Sodium: 139 mEq/L (ref 135–145)
Total Bilirubin: 0.5 mg/dL (ref 0.2–1.2)
Total Protein: 7.2 g/dL (ref 6.0–8.3)

## 2020-09-16 LAB — CBC WITH DIFFERENTIAL/PLATELET
Basophils Absolute: 0 10*3/uL (ref 0.0–0.1)
Basophils Relative: 0.8 % (ref 0.0–3.0)
Eosinophils Absolute: 0.1 10*3/uL (ref 0.0–0.7)
Eosinophils Relative: 1.5 % (ref 0.0–5.0)
HCT: 43.5 % (ref 36.0–46.0)
Hemoglobin: 14.3 g/dL (ref 12.0–15.0)
Lymphocytes Relative: 39.4 % (ref 12.0–46.0)
Lymphs Abs: 1.7 10*3/uL (ref 0.7–4.0)
MCHC: 32.9 g/dL (ref 30.0–36.0)
MCV: 90.3 fl (ref 78.0–100.0)
Monocytes Absolute: 0.5 10*3/uL (ref 0.1–1.0)
Monocytes Relative: 10.9 % (ref 3.0–12.0)
Neutro Abs: 2.1 10*3/uL (ref 1.4–7.7)
Neutrophils Relative %: 47.4 % (ref 43.0–77.0)
Platelets: 301 10*3/uL (ref 150.0–400.0)
RBC: 4.82 Mil/uL (ref 3.87–5.11)
RDW: 14.3 % (ref 11.5–15.5)
WBC: 4.4 10*3/uL (ref 4.0–10.5)

## 2020-09-16 LAB — VITAMIN B12: Vitamin B-12: 344 pg/mL (ref 211–911)

## 2020-09-16 LAB — TSH: TSH: 1.33 u[IU]/mL (ref 0.35–5.50)

## 2020-09-16 LAB — LIPID PANEL
Cholesterol: 258 mg/dL — ABNORMAL HIGH (ref 0–200)
HDL: 66.2 mg/dL (ref >39.00)
LDL Cholesterol: 153 mg/dL — ABNORMAL HIGH (ref 0–99)
NonHDL: 191.48
Total CHOL/HDL Ratio: 4
Triglycerides: 194 mg/dL — ABNORMAL HIGH (ref 0.0–149.0)
VLDL: 38.8 mg/dL (ref 0.0–40.0)

## 2020-09-16 LAB — IRON: Iron: 103 ug/dL (ref 42–145)

## 2020-09-16 LAB — T4, FREE: Free T4: 0.78 ng/dL (ref 0.60–1.60)

## 2020-09-16 LAB — VITAMIN D 25 HYDROXY (VIT D DEFICIENCY, FRACTURES): VITD: 23.99 ng/mL — ABNORMAL LOW (ref 30.00–100.00)

## 2020-09-16 MED ORDER — VITAMIN D (ERGOCALCIFEROL) 1.25 MG (50000 UNIT) PO CAPS: 50000.0000 [IU] | ORAL_CAPSULE | ORAL | 0 refills | Status: DC

## 2020-09-16 NOTE — Progress Notes (Signed)
Subjective:    Patient ID: Joanne Ibarra, female    DOB: January 21, 1965, 55 y.o.   MRN: [id number]  HPI   Here for cpe/fasting.  Pt now working in mental health/therapist. Pt has not been exercising. Pt states eating healthy. Non smoker. Rare alcohol use. Drinks decaffeinated coffee.   Rt ankle pain hurting for about 2 months. Pain came on pressure washing. Pain on and off. Uses ace wrap. Some throbbing pain at night when removes ace wrap.  Pt had initial 2 shot series of covid vaccine.  Pt will get tdap.  Pt will get flu vaccine at work.  Fatigued over past month. Sleeping adequate. Wonders if iron low. Pt states no known snoring.   Review of Systems  Constitutional:  Negative for chills, fatigue and fever.  HENT:  Negative for dental problem.   Respiratory:  Negative for cough, choking, chest tightness, shortness of breath and wheezing.   Cardiovascular:  Negative for chest pain and palpitations.  Gastrointestinal:  Negative for abdominal pain.  Genitourinary:  Negative for dysuria.  Musculoskeletal:  Negative for back pain.  Skin:  Negative for rash.  Neurological:  Negative for dizziness, weakness, numbness and headaches.  Hematological:  Negative for adenopathy. Does not bruise/bleed easily.  Psychiatric/Behavioral:  Negative for behavioral problems, confusion and hallucinations. The patient is not nervous/anxious.     Past Medical History:  Diagnosis Date   Allergy    seasonal allergies.   Dysrhythmia    PALPITATIONS YEARS AGO-NO CURRENT PROBLEMS   GERD (gastroesophageal reflux disease)    occasional.   Headache    H/O MIGRAINES   Hypertension    Vaginal dysplasia      Social History   Socioeconomic History   Marital status: Divorced    Spouse name: Not on file   Number of children: Not on file   Years of education: Not on file   Highest education level: Not on file  Occupational History   Not on file  Tobacco Use   Smoking status: Never    Smokeless tobacco: Never  Vaping Use   Vaping Use: Never used  Substance and Sexual Activity   Alcohol use: Yes    Comment: social   Drug use: No   Sexual activity: Yes  Other Topics Concern   Not on file  Social History Narrative   Not on file   Social Determinants of Health   Financial Resource Strain: Not on file  Food Insecurity: Not on file  Transportation Needs: Not on file  Physical Activity: Not on file  Stress: Not on file  Social Connections: Not on file  Intimate Partner Violence: Not on file    Past Surgical History:  Procedure Laterality Date   CERVICAL BIOPSY  W/ LOOP ELECTRODE EXCISION  7741   CO2 LASER APPLICATION N/A 05/10/9530   Procedure: CO2 LASER APPLICATION OF VAGINA, CERVIX;  Surgeon: Lafonda Mosses, MD;  Location: [place];  Service: Gynecology;  Laterality: N/A;   DILATATION & CURETTAGE/HYSTEROSCOPY WITH MYOSURE N/A 06/19/2015   Procedure: DILATATION & CURETTAGE/ HYSTEROSCOPY ;  Surgeon: Gae Dry, MD;  Location: [facility];  Service: Gynecology;  Laterality: N/A;   LEEP N/A 06/19/2015   Procedure: LOOP ELECTROSURGICAL EXCISION PROCEDURE (LEEP);  Surgeon: Gae Dry, MD;  Location: [facility];  Service: Gynecology;  Laterality: N/A;   lensectomy Bilateral 2016   for scar tissue build up    Family History  Problem Relation Age of Onset   Asthma  Sister    Food Allergy Sister    Asthma Daughter    Diabetes Mother    Hypertension Mother    Cancer Mother        melonoma of vulva   Cancer Father        melanoma   Breast cancer Cousin    Allergic rhinitis Neg Hx    Angioedema Neg Hx    Eczema Neg Hx    Immunodeficiency Neg Hx    Urticaria Neg Hx    Colon cancer Neg Hx    Esophageal cancer Neg Hx    Stomach cancer Neg Hx    Rectal cancer Neg Hx     No Known Allergies  Current Outpatient Medications on File Prior to Visit  Medication Sig Dispense Refill   fluticasone (FLONASE) 50 MCG/ACT nasal spray SPRAY 2  SPRAYS INTO EACH NOSTRIL EVERY DAY (Patient taking differently: as needed.) 16 mL 5   hydrochlorothiazide (HYDRODIURIL) 25 MG tablet TAKE 1 TABLET BY MOUTH EVERY DAY 30 tablet 11   omeprazole (PRILOSEC) 20 MG capsule Take 20 mg by mouth daily as needed.      Red Yeast Rice Extract (RED YEAST RICE PO) Take by mouth.     UNABLE TO FIND Med Name: magni calium d 3 tabs 1 time day     No current facility-administered medications on file prior to visit.    BP 118/80 (BP Location: Left Arm, Patient Position: Sitting, Cuff Size: Large)   Pulse 67   Temp 98.2 F (36.8 C)   Ht _0  (1.626 m)   Wt 181 lb 12.8 oz (82.5 kg)   LMP 09/28/2013   SpO2 97%   BMI 31.21 kg/m       Objective:   Physical Exam   General Mental Status- Alert. General Appearance- Not in acute distress.   Skin General: Color- Normal Color. Moisture- Normal Moisture.  Neck Carotid Arteries- Normal color. Moisture- Normal Moisture. No carotid bruits. No JVD.  Chest and Lung Exam Auscultation: Breath Sounds:-Normal.  Cardiovascular Auscultation:Rythm- Regular. Murmurs & Other Heart Sounds:Auscultation of the heart reveals- No Murmurs.  Abdomen Inspection:-Inspeection Normal. Palpation/Percussion:Note:No mass. Palpation and Percussion of the abdomen reveal- Non Tender, Non Distended + BS, no rebound or guarding.   Neurologic Cranial Nerve exam:- CN III-XII intact(No nystagmus), symmetric smile. Strength:- 5/5 equal and symmetric strength both upper and lower extremities.      Assessment & Plan:   Patient Instructions  For you wellness exam today I have ordered cbc, cmp, tsh, lipid panel and hep c antibody.  Vaccine today tdap. Flu vaccine at work. Consider covid booster.  Recommend exercise and healthy diet.  We will let you know lab results as they come in.  Follow up date appointment will be determined after lab review.    For ankle pain offered sport med referral. Declined. If you change mind  let us know.  For fatigue adding tsh, t4, b12, b1, vit d and iron level.  Preventive Care 35-61 Years Old, Female Preventive care refers to lifestyle choices and visits with your health care provider that can promote health and wellness. This includes: A yearly physical exam. This is also called an annual wellness visit. Regular dental and eye exams. Immunizations. Screening for certain conditions. Healthy lifestyle choices, such as: Eating a healthy diet. Getting regular exercise. Not using drugs or products that contain nicotine and tobacco. Limiting alcohol use. What can I expect for my preventive care visit? Physical exam Your health care provider  will check your: Height and weight. These may be used to calculate your BMI (body mass index). BMI is a measurement that tells if you are at a healthy weight. Heart rate and blood pressure. Body temperature. Skin for abnormal spots. Counseling Your health care provider may ask you questions about your: Past medical problems. Family's medical history. Alcohol, tobacco, and drug use. Emotional well-being. Home life and relationship well-being. Sexual activity. Diet, exercise, and sleep habits. Work and work Statistician. Access to firearms. Method of birth control. Menstrual cycle. Pregnancy history. What immunizations do I need? Vaccines are usually given at various ages, according to a schedule. Your health care provider will recommend vaccines for you based on your age, medical history, and lifestyle or other factors, such as travel or where you work. What tests do I need? Blood tests Lipid and cholesterol levels. These may be checked every 5 years, or more often if you are over 55 years old. Hepatitis C test. Hepatitis B test. Screening Lung cancer screening. You may have this screening every year starting at age 55 if you have a 30-pack-year history of smoking and currently smoke or have quit within the past 15  years. Colorectal cancer screening. All adults should have this screening starting at age 57 and continuing until age 16. Your health care provider may recommend screening at age 51 if you are at increased risk. You will have tests every 1-10 years, depending on your results and the type of screening test. Diabetes screening. This is done by checking your blood sugar (glucose) after you have not eaten for a while (fasting). You may have this done every 1-3 years. Mammogram. This may be done every 1-2 years. Talk with your health care provider about when you should start having regular mammograms. This may depend on whether you have a family history of breast cancer. BRCA-related cancer screening. This may be done if you have a family history of breast, ovarian, tubal, or peritoneal cancers. Pelvic exam and Pap test. This may be done every 3 years starting at age 36. Starting at age 35, this may be done every 5 years if you have a Pap test in combination with an HPV test. Other tests STD (sexually transmitted disease) testing, if you are at risk. Bone density scan. This is done to screen for osteoporosis. You may have this scan if you are at high risk for osteoporosis. Talk with your health care provider about your test results, treatment options, and if necessary, the need for more tests. Follow these instructions at home: Eating and drinking  Eat a diet that includes fresh fruits and vegetables, whole grains, lean protein, and low-fat dairy products. Take vitamin and mineral supplements as recommended by your health care provider. Do not drink alcohol if: Your health care provider tells you not to drink. You are pregnant, may be pregnant, or are planning to become pregnant. If you drink alcohol: Limit how much you have to 0-1 drink a day. Be aware of how much alcohol is in your drink. In the [place] one drink equals one 12 oz bottle of beer (355 mL), one 5 oz glass of wine (148 mL), or  one 1 oz glass of hard liquor (44 mL). Lifestyle Take daily care of your teeth and gums. Brush your teeth every morning and night with fluoride toothpaste. Floss one time each day. Stay active. Exercise for at least 30 minutes 5 or more days each week. Do not use any products that contain nicotine or  tobacco, such as cigarettes, e-cigarettes, and chewing tobacco. If you need help quitting, ask your health care provider. Do not use drugs. If you are sexually active, practice safe sex. Use a condom or other form of protection to prevent STIs (sexually transmitted infections). If you do not wish to become pregnant, use a form of birth control. If you plan to become pregnant, see your health care provider for a prepregnancy visit. If told by your health care provider, take low-dose aspirin daily starting at age 94. Find healthy ways to cope with stress, such as: Meditation, yoga, or listening to music. Journaling. Talking to a trusted person. Spending time with friends and family. Safety Always wear your seat belt while driving or riding in a vehicle. Do not drive: If you have been drinking alcohol. Do not ride with someone who has been drinking. When you are tired or distracted. While texting. Wear a helmet and other protective equipment during sports activities. If you have firearms in your house, make sure you follow all gun safety procedures. What's next? Visit your health care provider once a year for an annual wellness visit. Ask your health care provider how often you should have your eyes and teeth checked. Stay up to date on all vaccines. This information is not intended to replace advice given to you by your health care provider. Make sure you discuss any questions you have with your health care provider. Document Revised: 03/13/2020 Document Reviewed: 09/14/2017 Elsevier Patient Education  2022 [place], [place]. Did address fatigue. Labs ordered  and discussed ankle pain.

## 2020-09-16 NOTE — Addendum Note (Signed)
Addended by: Anabel Halon on: 09/16/2020 06:03 PM   Modules accepted: Orders

## 2020-09-16 NOTE — Addendum Note (Signed)
Addended by: Kelle Darting A on: 09/16/2020 08:46 AM   Modules accepted: Orders

## 2020-09-16 NOTE — Patient Instructions (Signed)
For you wellness exam today I have ordered cbc, cmp, tsh, lipid panel and hep c antibody.  Vaccine today tdap. Flu vaccine at work. Consider covid booster.  Recommend exercise and healthy diet.  We will let you know lab results as they come in.  Follow up date appointment will be determined after lab review.    For ankle pain offered sport med referral. Declined. If you change mind let us know.  For fatigue adding tsh, t4, b12, b1, vit d and iron level.  Preventive Care 96-24 Years Old, Female Preventive care refers to lifestyle choices and visits with your health care provider that can promote health and wellness. This includes: A yearly physical exam. This is also called an annual wellness visit. Regular dental and eye exams. Immunizations. Screening for certain conditions. Healthy lifestyle choices, such as: Eating a healthy diet. Getting regular exercise. Not using drugs or products that contain nicotine and tobacco. Limiting alcohol use. What can I expect for my preventive care visit? Physical exam Your health care provider will check your: Height and weight. These may be used to calculate your BMI (body mass index). BMI is a measurement that tells if you are at a healthy weight. Heart rate and blood pressure. Body temperature. Skin for abnormal spots. Counseling Your health care provider may ask you questions about your: Past medical problems. Family's medical history. Alcohol, tobacco, and drug use. Emotional well-being. Home life and relationship well-being. Sexual activity. Diet, exercise, and sleep habits. Work and work Statistician. Access to firearms. Method of birth control. Menstrual cycle. Pregnancy history. What immunizations do I need? Vaccines are usually given at various ages, according to a schedule. Your health care provider will recommend vaccines for you based on your age, medical history, and lifestyle or other factors, such as travel or where you  work. What tests do I need? Blood tests Lipid and cholesterol levels. These may be checked every 5 years, or more often if you are over 91 years old. Hepatitis C test. Hepatitis B test. Screening Lung cancer screening. You may have this screening every year starting at age 20 if you have a 30-pack-year history of smoking and currently smoke or have quit within the past 15 years. Colorectal cancer screening. All adults should have this screening starting at age 79 and continuing until age 6. Your health care provider may recommend screening at age 45 if you are at increased risk. You will have tests every 1-10 years, depending on your results and the type of screening test. Diabetes screening. This is done by checking your blood sugar (glucose) after you have not eaten for a while (fasting). You may have this done every 1-3 years. Mammogram. This may be done every 1-2 years. Talk with your health care provider about when you should start having regular mammograms. This may depend on whether you have a family history of breast cancer. BRCA-related cancer screening. This may be done if you have a family history of breast, ovarian, tubal, or peritoneal cancers. Pelvic exam and Pap test. This may be done every 3 years starting at age 29. Starting at age 19, this may be done every 5 years if you have a Pap test in combination with an HPV test. Other tests STD (sexually transmitted disease) testing, if you are at risk. Bone density scan. This is done to screen for osteoporosis. You may have this scan if you are at high risk for osteoporosis. Talk with your health care provider about your test results,  treatment options, and if necessary, the need for more tests. Follow these instructions at home: Eating and drinking  Eat a diet that includes fresh fruits and vegetables, whole grains, lean protein, and low-fat dairy products. Take vitamin and mineral supplements as recommended by your health  care provider. Do not drink alcohol if: Your health care provider tells you not to drink. You are pregnant, may be pregnant, or are planning to become pregnant. If you drink alcohol: Limit how much you have to 0-1 drink a day. Be aware of how much alcohol is in your drink. In the [place] one drink equals one 12 oz bottle of beer (355 mL), one 5 oz glass of wine (148 mL), or one 1 oz glass of hard liquor (44 mL). Lifestyle Take daily care of your teeth and gums. Brush your teeth every morning and night with fluoride toothpaste. Floss one time each day. Stay active. Exercise for at least 30 minutes 5 or more days each week. Do not use any products that contain nicotine or tobacco, such as cigarettes, e-cigarettes, and chewing tobacco. If you need help quitting, ask your health care provider. Do not use drugs. If you are sexually active, practice safe sex. Use a condom or other form of protection to prevent STIs (sexually transmitted infections). If you do not wish to become pregnant, use a form of birth control. If you plan to become pregnant, see your health care provider for a prepregnancy visit. If told by your health care provider, take low-dose aspirin daily starting at age 36. Find healthy ways to cope with stress, such as: Meditation, yoga, or listening to music. Journaling. Talking to a trusted person. Spending time with friends and family. Safety Always wear your seat belt while driving or riding in a vehicle. Do not drive: If you have been drinking alcohol. Do not ride with someone who has been drinking. When you are tired or distracted. While texting. Wear a helmet and other protective equipment during sports activities. If you have firearms in your house, make sure you follow all gun safety procedures. What's next? Visit your health care provider once a year for an annual wellness visit. Ask your health care provider how often you should have your eyes and teeth checked. Stay  up to date on all vaccines. This information is not intended to replace advice given to you by your health care provider. Make sure you discuss any questions you have with your health care provider. Document Revised: 03/13/2020 Document Reviewed: 09/14/2017 Elsevier Patient Education  2022 [employer].

## 2020-09-16 NOTE — Addendum Note (Signed)
Addended by: Legrand Rams on: 09/16/2020 08:44 AM   Modules accepted: Orders

## 2020-09-17 LAB — HEPATITIS C ANTIBODY
Hepatitis C Ab: NONREACTIVE
SIGNAL TO CUT-OFF: 0.01 (ref <1.00)

## 2020-09-19 LAB — VITAMIN B1: Vitamin B1 (Thiamine): 13 nmol/L (ref 8–30)

## 2020-10-27 ENCOUNTER — Other Ambulatory Visit: Payer: Self-pay | Admitting: Medical

## 2020-11-13 ENCOUNTER — Other Ambulatory Visit: Payer: Self-pay | Admitting: Medical

## 2020-11-17 ENCOUNTER — Ambulatory Visit: Payer: PRIVATE HEALTH INSURANCE | Admitting: Medical

## 2020-12-08 ENCOUNTER — Encounter: Payer: Self-pay | Admitting: Medical

## 2020-12-08 ENCOUNTER — Ambulatory Visit: Payer: BC Managed Care – PPO | Admitting: Medical

## 2020-12-08 ENCOUNTER — Other Ambulatory Visit: Payer: Self-pay

## 2020-12-08 ENCOUNTER — Ambulatory Visit (HOSPITAL_BASED_OUTPATIENT_CLINIC_OR_DEPARTMENT_OTHER)
Admission: RE | Admit: 2020-12-08 | Discharge: 2020-12-08 | Disposition: A | Discharge status: Home / Self Care | Payer: BC Managed Care – PPO | Source: Ambulatory Visit | Attending: Medical | Admitting: Medical

## 2020-12-08 VITALS — BP 140/85 | HR 76 | Temp 98.2°F | Resp 18 | Ht 64.0 in | Wt 179.0 lb

## 2020-12-08 DIAGNOSIS — R058 Other specified cough: Secondary | ICD-10-CM | POA: Insufficient documentation

## 2020-12-08 DIAGNOSIS — E782 Mixed hyperlipidemia: Secondary | ICD-10-CM | POA: Diagnosis not present

## 2020-12-08 DIAGNOSIS — E559 Vitamin D deficiency, unspecified: Secondary | ICD-10-CM

## 2020-12-08 DIAGNOSIS — J302 Other seasonal allergic rhinitis: Secondary | ICD-10-CM

## 2020-12-08 DIAGNOSIS — I10 Essential (primary) hypertension: Secondary | ICD-10-CM

## 2020-12-08 DIAGNOSIS — R5383 Other fatigue: Secondary | ICD-10-CM

## 2020-12-08 DIAGNOSIS — R059 Cough, unspecified: Secondary | ICD-10-CM | POA: Diagnosis not present

## 2020-12-08 LAB — COMPREHENSIVE METABOLIC PANEL
ALT: 20 U/L (ref 0–35)
AST: 22 U/L (ref 0–37)
Albumin: 4.2 g/dL (ref 3.5–5.2)
Alkaline Phosphatase: 75 U/L (ref 39–117)
BUN: 13 mg/dL (ref 6–23)
CO2: 32 mEq/L (ref 19–32)
Calcium: 9.4 mg/dL (ref 8.4–10.5)
Chloride: 101 mEq/L (ref 96–112)
Creatinine, Ser: 0.93 mg/dL (ref 0.40–1.20)
GFR: 69.07 mL/min (ref >60.00)
Glucose, Bld: 102 mg/dL — ABNORMAL HIGH (ref 70–99)
Potassium: 3.6 mEq/L (ref 3.5–5.1)
Sodium: 139 mEq/L (ref 135–145)
Total Bilirubin: 0.5 mg/dL (ref 0.2–1.2)
Total Protein: 6.9 g/dL (ref 6.0–8.3)

## 2020-12-08 LAB — LIPID PANEL
Cholesterol: 236 mg/dL — ABNORMAL HIGH (ref 0–200)
HDL: 64.9 mg/dL (ref >39.00)
LDL Cholesterol: 139 mg/dL — ABNORMAL HIGH (ref 0–99)
NonHDL: 170.62
Total CHOL/HDL Ratio: 4
Triglycerides: 160 mg/dL — ABNORMAL HIGH (ref 0.0–149.0)
VLDL: 32 mg/dL (ref 0.0–40.0)

## 2020-12-08 LAB — VITAMIN B12: Vitamin B-12: >1550 pg/mL — ABNORMAL HIGH (ref 211–911)

## 2020-12-08 MED ORDER — LEVOCETIRIZINE DIHYDROCHLORIDE 5 MG PO TABS: 5.0000 mg | ORAL_TABLET | Freq: Every evening | ORAL | 0 refills | Status: DC

## 2020-12-08 MED ORDER — HYDROCODONE BIT-HOMATROP MBR 5-1.5 MG/5ML PO SOLN: 5.0000 mL | Freq: Four times a day (QID) | ORAL | 0 refills | Status: DC | PRN

## 2020-12-08 NOTE — Progress Notes (Signed)
Subjective:    Patient ID: Joanne Ibarra, female    DOB: 1965/02/23, 55 y.o.   MRN: [id number]  HPI  Pt in for follow up on high cholesterol and low vit d.  Pt is on red yeast rice extract.   The 10-year ASCVD risk score (Arnett DK, et al., 2019) is: 3.1%   Values used to calculate the score:     Age: 81 years     Sex: Female     Is Non-Hispanic African American: No     Diabetic: No     Tobacco smoker: No     Systolic Blood Pressure: 546 mmHg     Is BP treated: Yes     HDL Cholesterol: 66.2 mg/dL     Total Cholesterol: 258 mg/dL   Pt sister had MI at age 11 yo. Sister and pt share mother. Different fathers. Pt not aware of her dad cardiovacsular history.   Pt has recent dry cough for 4 weeks. Cough is dry. No nasal congestion. No post nasal drainage. No sneezing or itching eyes. States has allergies but has not been acting up. Pt tried allegra and it did not help. No hx of smoking. She states pt daughter does smoke and occasional exposure. Daughter does vape inside and pt can smell.  A week after cough she tested for covid and came back negative.  Pt states she still has fatigue. Lab work up was negative except for vit d which she has been taking. She states fatigue is mildly better. Pt states sleeps 8 hours. Told does not snore.     Review of Systems  Constitutional:  Negative for chills and fever.  HENT:  Positive for postnasal drip. Negative for congestion, drooling, nosebleeds, sinus pressure and sinus pain.   Eyes:  Negative for photophobia, pain and redness.  Respiratory:  Positive for cough. Negative for choking, chest tightness, shortness of breath and wheezing.   Cardiovascular:  Negative for chest pain and palpitations.  Gastrointestinal:  Negative for abdominal pain, blood in stool and constipation.       No reflux.  Genitourinary:  Negative for dysuria, flank pain and frequency.  Musculoskeletal:  Negative for back pain, joint swelling and neck pain.     Past Medical History:  Diagnosis Date   Allergy    seasonal allergies.   Dysrhythmia    PALPITATIONS YEARS AGO-NO CURRENT PROBLEMS   GERD (gastroesophageal reflux disease)    occasional.   Headache    H/O MIGRAINES   Hypertension    Vaginal dysplasia      Social History   Socioeconomic History   Marital status: Divorced    Spouse name: Not on file   Number of children: Not on file   Years of education: Not on file   Highest education level: Not on file  Occupational History   Not on file  Tobacco Use   Smoking status: Never   Smokeless tobacco: Never  Vaping Use   Vaping Use: Never used  Substance and Sexual Activity   Alcohol use: Yes    Comment: social   Drug use: No   Sexual activity: Yes  Other Topics Concern   Not on file  Social History Narrative   Not on file   Social Determinants of Health   Financial Resource Strain: Not on file  Food Insecurity: Not on file  Transportation Needs: Not on file  Physical Activity: Not on file  Stress: Not on file  Social Connections: Not on  file  Intimate Partner Violence: Not on file    Past Surgical History:  Procedure Laterality Date   CERVICAL BIOPSY  W/ LOOP ELECTRODE EXCISION  8657   CO2 LASER APPLICATION N/A 8/46/9629   Procedure: CO2 LASER APPLICATION OF VAGINA, CERVIX;  Surgeon: Lafonda Mosses, MD;  Location: [facility];  Service: Gynecology;  Laterality: N/A;   DILATATION & CURETTAGE/HYSTEROSCOPY WITH MYOSURE N/A 06/19/2015   Procedure: DILATATION & CURETTAGE/ HYSTEROSCOPY ;  Surgeon: Gae Dry, MD;  Location: [facility];  Service: Gynecology;  Laterality: N/A;   LEEP N/A 06/19/2015   Procedure: LOOP ELECTROSURGICAL EXCISION PROCEDURE (LEEP);  Surgeon: Gae Dry, MD;  Location: [facility];  Service: Gynecology;  Laterality: N/A;   lensectomy Bilateral 2016   for scar tissue build up    Family History  Problem Relation Age of Onset   Asthma Sister    Food Allergy Sister     Asthma Daughter    Diabetes Mother    Hypertension Mother    Cancer Mother        melonoma of vulva   Cancer Father        melanoma   Breast cancer Cousin    Allergic rhinitis Neg Hx    Angioedema Neg Hx    Eczema Neg Hx    Immunodeficiency Neg Hx    Urticaria Neg Hx    Colon cancer Neg Hx    Esophageal cancer Neg Hx    Stomach cancer Neg Hx    Rectal cancer Neg Hx     No Known Allergies  Current Outpatient Medications on File Prior to Visit  Medication Sig Dispense Refill   fluticasone (FLONASE) 50 MCG/ACT nasal spray SPRAY 2 SPRAYS INTO EACH NOSTRIL EVERY DAY (Patient taking differently: as needed.) 16 mL 5   hydrochlorothiazide (HYDRODIURIL) 25 MG tablet TAKE 1 TABLET BY MOUTH EVERY DAY 30 tablet 11   omeprazole (PRILOSEC) 20 MG capsule Take 20 mg by mouth daily as needed.      Red Yeast Rice Extract (RED YEAST RICE PO) Take by mouth.     UNABLE TO FIND Med Name: magni calium d 3 tabs 1 time day     Vitamin D, Ergocalciferol, (DRISDOL) 1.25 MG (50000 UNIT) CAPS capsule TAKE 1 CAPSULE BY MOUTH EVERY 7 DAYS 8 capsule 0   No current facility-administered medications on file prior to visit.    BP (!) 132/99 (BP Location: Left Arm, Patient Position: Sitting, Cuff Size: Normal)   Pulse 76   Temp 98.2 F (36.8 C) (Oral)   Resp 18   Ht 5\' 4"  (1.626 m)   Wt 179 lb (81.2 kg)   LMP 09/28/2013   SpO2 99%   BMI 30.73 kg/m        Objective:   Physical Exam  General Mental Status- Alert. General Appearance- Not in acute distress.   Skin General: Color- Normal Color. Moisture- Normal Moisture.  Neck Carotid Arteries- Normal color. Moisture- Normal Moisture. No carotid bruits. No JVD.  Chest and Lung Exam Auscultation: Breath Sounds:-Normal.  Cardiovascular Auscultation:Rythm- Regular. Murmurs & Other Heart Sounds:Auscultation of the heart reveals- No Murmurs.  Abdomen Inspection:-Inspeection Normal. Palpation/Percussion:Note:No mass. Palpation and Percussion  of the abdomen reveal- Non Tender, Non Distended + BS, no rebound or guarding.   Neurologic Cranial Nerve exam:- CN III-XII intact(No nystagmus), symmetric smile. Strength:- 5/5 equal and symmetric strength both upper and lower extremities.   Heent- no sinus pressure, no boggy turbinates. No sinus pressure. +pnd  and boggy turbinates.     Assessment & Plan:   Patient Instructions  Recent dry cough for 1 month.  We will get a chest x-ray.  Do recommend that you avoid any exposure to vape smoke as this might be a factor.  On review no reflux reported and no wheezing.  History of allergic rhinitis and this might be cause.  Prescribing Xyzal antihistamine to take 1 tablet at night.  Also since cough disrupting your sleep will prescribe Hycodan cough syrup.  Rx advisement given.  Will get chest x-ray.  Fatigue with work-up mostly negative on prior labs.  Only found to have low vitamin D and lower and B12 level.  We will repeat vitamin D level and a B12 level today.  Hopefully those are in the mid to high normal range.  Hypertension-on recheck your blood pressure is borderline level today.  Continue chlorthalidone.  Check blood pressures daily at home and give me MyChart update in 5 to 7 days.  Hyperlipidemia-we will recheck lipid panel with metabolic panel today.  Might consider prescription medication if cholesterol worse.  Follow-up in 2 to 3 weeks or sooner if needed.   Mackie Pai, PA-C

## 2020-12-08 NOTE — Patient Instructions (Addendum)
Recent dry cough for 1 month.  We will get a chest x-ray.  Do recommend that you avoid any exposure to vape smoke as this might be a factor.  On review no reflux reported and no wheezing.  History of allergic rhinitis and this might be cause.  Prescribing Xyzal antihistamine to take 1 tablet at night.  Also since cough disrupting your sleep will prescribe Hycodan cough syrup.  Rx advisement given.  Will get chest x-ray.  Fatigue with work-up mostly negative on prior labs.  Only found to have low vitamin D and lower and B12 level.  We will repeat vitamin D level and a B12 level today.  Hopefully those are in the mid to high normal range.  Hypertension-on recheck your blood pressure is borderline level today.  Continue chlorthalidone.  Check blood pressures daily at home and give me MyChart update in 5 to 7 days.  Hyperlipidemia-we will recheck lipid panel with metabolic panel today.  Might consider prescription medication if cholesterol worse.  Follow-up in 2 to 3 weeks or sooner if needed.

## 2020-12-13 LAB — VITAMIN D 1,25 DIHYDROXY
Vitamin D 1, 25 (OH)2 Total: 63 pg/mL (ref 18–72)
Vitamin D2 1, 25 (OH)2: 35 pg/mL
Vitamin D3 1, 25 (OH)2: 28 pg/mL

## 2020-12-22 ENCOUNTER — Other Ambulatory Visit: Payer: Self-pay | Admitting: Medical

## 2020-12-22 DIAGNOSIS — N879 Dysplasia of cervix uteri, unspecified: Secondary | ICD-10-CM | POA: Diagnosis not present

## 2020-12-22 DIAGNOSIS — N891 Moderate vaginal dysplasia: Secondary | ICD-10-CM | POA: Diagnosis not present

## 2020-12-23 DIAGNOSIS — R87612 Low grade squamous intraepithelial lesion on cytologic smear of cervix (LGSIL): Secondary | ICD-10-CM | POA: Diagnosis not present

## 2020-12-23 NOTE — Telephone Encounter (Signed)
Can you please deny refill

## 2021-02-09 ENCOUNTER — Ambulatory Visit: Payer: BC Managed Care – PPO | Admitting: Medical

## 2021-03-01 ENCOUNTER — Ambulatory Visit: Payer: BC Managed Care – PPO | Admitting: Medical

## 2021-04-19 ENCOUNTER — Telehealth: Payer: BC Managed Care – PPO | Admitting: Physician Assistant

## 2021-04-19 DIAGNOSIS — J020 Streptococcal pharyngitis: Secondary | ICD-10-CM

## 2021-04-19 MED ORDER — AMOXICILLIN 500 MG PO CAPS: 500.0000 mg | ORAL_CAPSULE | Freq: Two times a day (BID) | ORAL | 0 refills | Status: AC

## 2021-04-19 NOTE — Progress Notes (Signed)
?Virtual Visit Consent  ? ?Joanne Ibarra, you are scheduled for a virtual visit with a [facility] provider today.   ?  ?Just as with appointments in the office, your consent must be obtained to participate.  Your consent will be active for this visit and any virtual visit you may have with one of our providers in the next 365 days.   ?  ?If you have a MyChart account, a copy of this consent can be sent to you electronically.  All virtual visits are billed to your insurance company just like a traditional visit in the office.   ? ?As this is a virtual visit, video technology does not allow for your provider to perform a traditional examination.  This may limit your provider's ability to fully assess your condition.  If your provider identifies any concerns that need to be evaluated in person or the need to arrange testing (such as labs, EKG, etc.), we will make arrangements to do so.   ?  ?Although advances in technology are sophisticated, we cannot ensure that it will always work on either your end or our end.  If the connection with a video visit is poor, the visit may have to be switched to a telephone visit.  With either a video or telephone visit, we are not always able to ensure that we have a secure connection.    ? ?I need to obtain your verbal consent now.   Are you willing to proceed with your visit today?  ?  ?Joanne Ibarra has provided verbal consent on 04/19/2021 for a virtual visit (video or telephone). ?  ?Mar Daring, PA-C  ? ?Date: 04/19/2021 8:06 AM ? ? ?Virtual Visit via Video Note  ? ?Joanne Ibarra, connected with  Joanne Ibarra  ([id number], [birth year]) on 04/19/21 at  8:00 AM EDT by a video-enabled telemedicine application and verified that I am speaking with the correct person using two identifiers. ? ?Location: ?Patient: Virtual Visit Location Patient: Home ?Provider: Virtual Visit Location Provider: Home Office ?  ?I discussed the limitations of evaluation and  management by telemedicine and the availability of in person appointments. The patient expressed understanding and agreed to proceed.   ? ?History of Present Illness: ?Joanne Ibarra is a 56 y.o. who identifies as a female who was assigned female at birth, and is being seen today for sore throat. ? ?HPI: Sore Throat  ?This is a new problem. The current episode started yesterday. The problem has been gradually worsening. Maximum temperature: subjective fever. The pain is moderate. Associated symptoms include headaches, a hoarse voice and swollen glands. Associated symptoms comments: Chills. She has had exposure to strep. She has had no exposure to mono. She has tried acetaminophen and NSAIDs for the symptoms. The treatment provided no relief.   ? ? ?Problems:  ?Patient Active Problem List  ? Diagnosis Date Noted  ? 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 Postmenopausal bleeding 06/19/2015  ? Cervical dysplasia 06/19/2015  ? Allergic rhinitis due to pollen 03/31/2015  ? Gastroesophageal reflux disease without esophagitis 03/31/2015  ? Wheezing 03/31/2015  ?  ?Allergies: No Known Allergies ?Medications:  ?Current Outpatient Medications:  ?  amoxicillin (AMOXIL) 500 MG capsule, Take 1 capsule (500 mg total) by mouth 2 (two) times daily for 10 days., Disp: 20 capsule, Rfl: 0 ?  fluticasone (FLONASE) 50 MCG/ACT nasal spray, SPRAY 2 SPRAYS INTO EACH NOSTRIL EVERY DAY (Patient taking differently: as needed.),  Disp: 16 mL, Rfl: 5 ?  hydrochlorothiazide (HYDRODIURIL) 25 MG tablet, TAKE 1 TABLET BY MOUTH EVERY DAY, Disp: 30 tablet, Rfl: 11 ?  HYDROcodone bit-homatropine (HYCODAN) 5-1.5 MG/5ML syrup, Take 5 mLs by mouth every 6 (six) hours as needed for cough., Disp: 120 mL, Rfl: 0 ?  levocetirizine (XYZAL) 5 MG tablet, Take 1 tablet (5 mg total) by mouth every evening., Disp: 30 tablet, Rfl: 0 ?  omeprazole (PRILOSEC) 20 MG capsule, Take 20 mg by mouth daily as needed. , Disp: , Rfl:  ?  Red Yeast  Rice Extract (RED YEAST RICE PO), Take by mouth., Disp: , Rfl:  ?  UNABLE TO FIND, Med Name: magni calium d 3 tabs 1 time day, Disp: , Rfl:  ?  Vitamin D, Ergocalciferol, (DRISDOL) 1.25 MG (50000 UNIT) CAPS capsule, TAKE 1 CAPSULE BY MOUTH EVERY 7 DAYS, Disp: 8 capsule, Rfl: 0 ? ?Observations/Objective: ?Patient is well-developed, well-nourished in no acute distress.  ?Resting comfortably at home.  ?Head is normocephalic, atraumatic.  ?No labored breathing.  ?Speech is clear and coherent with logical content.  ?Patient is alert and oriented at baseline.  ? ? ?Assessment and Plan: ?1. Strep pharyngitis ?- amoxicillin (AMOXIL) 500 MG capsule; Take 1 capsule (500 mg total) by mouth 2 (two) times daily for 10 days.  Dispense: 20 capsule; Refill: 0 ? ?- Suspect strep throat (exposure from granddaughter) ?- Amoxicillin prescribed ?- Salt water gargles ?- Chloraseptic spray ?- Tylenol and Ibuprofen as needed ?- Push fluids ?- Seek in person evaluation if not improving or if symptoms worsen ? ?Follow Up Instructions: ?I discussed the assessment and treatment plan with the patient. The patient was provided an opportunity to ask questions and all were answered. The patient agreed with the plan and demonstrated an understanding of the instructions.  A copy of instructions were sent to the patient via MyChart unless otherwise noted below.  ? ? ?The patient was advised to call back or seek an in-person evaluation if the symptoms worsen or if the condition fails to improve as anticipated. ? ?Time:  ?I spent 10 minutes with the patient via telehealth technology discussing the above problems/concerns.   ? ?Mar Daring, PA-C ?

## 2021-04-19 NOTE — Patient Instructions (Signed)
?Thresia R Heidelberg, thank you for joining Mar Daring, PA-C for today's virtual visit.  While this provider is not your primary care provider (PCP), if your PCP is located in our provider database this encounter information will be shared with them immediately following your visit. ? ?Consent: ?(Patient) Joanne Ibarra provided verbal consent for this virtual visit at the beginning of the encounter. ? ?Current Medications: ? ?Current Outpatient Medications:  ?  amoxicillin (AMOXIL) 500 MG capsule, Take 1 capsule (500 mg total) by mouth 2 (two) times daily for 10 days., Disp: 20 capsule, Rfl: 0 ?  fluticasone (FLONASE) 50 MCG/ACT nasal spray, SPRAY 2 SPRAYS INTO EACH NOSTRIL EVERY DAY (Patient taking differently: as needed.), Disp: 16 mL, Rfl: 5 ?  hydrochlorothiazide (HYDRODIURIL) 25 MG tablet, TAKE 1 TABLET BY MOUTH EVERY DAY, Disp: 30 tablet, Rfl: 11 ?  HYDROcodone bit-homatropine (HYCODAN) 5-1.5 MG/5ML syrup, Take 5 mLs by mouth every 6 (six) hours as needed for cough., Disp: 120 mL, Rfl: 0 ?  levocetirizine (XYZAL) 5 MG tablet, Take 1 tablet (5 mg total) by mouth every evening., Disp: 30 tablet, Rfl: 0 ?  omeprazole (PRILOSEC) 20 MG capsule, Take 20 mg by mouth daily as needed. , Disp: , Rfl:  ?  Red Yeast Rice Extract (RED YEAST RICE PO), Take by mouth., Disp: , Rfl:  ?  UNABLE TO FIND, Med Name: magni calium d 3 tabs 1 time day, Disp: , Rfl:  ?  Vitamin D, Ergocalciferol, (DRISDOL) 1.25 MG (50000 UNIT) CAPS capsule, TAKE 1 CAPSULE BY MOUTH EVERY 7 DAYS, Disp: 8 capsule, Rfl: 0  ? ?Medications ordered in this encounter:  ?Meds ordered this encounter  ?Medications  ? amoxicillin (AMOXIL) 500 MG capsule  ?  Sig: Take 1 capsule (500 mg total) by mouth 2 (two) times daily for 10 days.  ?  Dispense:  20 capsule  ?  Refill:  0  ?  Order Specific Question:   Supervising Provider  ?  Answer:   Noemi Chapel [[id number]]  ?  ? ?*If you need refills on other medications prior to your next appointment, please  contact your pharmacy* ? ?Follow-Up: ?Call back or seek an in-person evaluation if the symptoms worsen or if the condition fails to improve as anticipated. ? ?Other Instructions ?Strep Throat, Adult ?Strep throat is an infection in the throat that is caused by bacteria. It is common during the cold months of the year. It mostly affects children who are 12-9 years old. However, people of all ages can get it at any time of the year. This infection spreads from person to person (is contagious) through coughing, sneezing, or having close contact. ?Your health care provider may use other names to describe the infection. When strep throat affects the tonsils, it is called tonsillitis. When it affects the back of the throat, it is called pharyngitis. ?What are the causes? ?This condition is caused by the Streptococcus pyogenes bacteria. ?What increases the risk? ?You are more likely to develop this condition if: ?You care for school-age children, or are around school-age children. Children are more likely to get strep throat and may spread it to others. ?You spend time in crowded places where the infection can spread easily. ?You have close contact with someone who has strep throat. ?What are the signs or symptoms? ?Symptoms of this condition include: ?Fever or chills. ?Redness, swelling, or pain in the tonsils or throat. ?Pain or difficulty when swallowing. ?White or yellow spots on the tonsils or  throat. ?Tender glands in the neck and under the jaw. ?Bad smelling breath. ?Red rash all over the body. This is rare. ?How is this diagnosed? ?This condition is diagnosed by tests that check for the presence and the amount of bacteria that cause strep throat. They are: ?Rapid strep test. Your throat is swabbed and checked for the presence of bacteria. Results are usually ready in minutes. ?Throat culture test. Your throat is swabbed. The sample is placed in a cup that allows infections to grow. Results are usually ready in 1 or  2 days. ?How is this treated? ?This condition may be treated with: ?Medicines that kill germs (antibiotics). ?Medicines that relieve pain or fever. These include: ?Ibuprofen or acetaminophen. ?Aspirin, only for people who are over the age of 84. ?Throat lozenges. ?Throat sprays. ?Follow these instructions at home: ?Medicines ? ?Take over-the-counter and prescription medicines only as told by your health care provider. ?Take your antibiotic medicine as told by your health care provider. Do not stop taking the antibiotic even if you start to feel better. ?Eating and drinking ? ?If you have trouble swallowing, try eating soft foods until your sore throat feels better. ?Drink enough fluid to keep your urine pale yellow. ?To help relieve pain, you may have: ?Warm fluids, such as soup and tea. ?Cold fluids, such as frozen desserts or popsicles. ?General instructions ?Gargle with a salt-water mixture 3-4 times a day or as needed. To make a salt-water mixture, completely dissolve ?-1 tsp (3-6 g) of salt in 1 cup (237 mL) of warm water. ?Get plenty of rest. ?Stay home from work or school until you have been taking antibiotics for 24 hours. ?Do not use any products that contain nicotine or tobacco. These products include cigarettes, chewing tobacco, and vaping devices, such as e-cigarettes. If you need help quitting, ask your health care provider. ?It is up to you to get your test results. Ask your health care provider, or the department that is doing the test, when your results will be ready. ?Keep all follow-up visits. This is important. ?How is this prevented? ? ?Do not share food, drinking cups, or personal items that could cause the infection to spread to other people. ?Wash your hands often with soap and water for at least 20 seconds. If soap and water are not available, use hand sanitizer. Make sure that all people in your house wash their hands well. ?Have family members tested if they have a sore throat or fever.  They may need an antibiotic if they have strep throat. ?Contact a health care provider if: ?You have swelling in your neck that keeps getting bigger. ?You develop a rash, cough, or earache. ?You cough up a thick mucus that is green, yellow-brown, or bloody. ?You have pain or discomfort that does not get better with medicine. ?Your symptoms seem to be getting worse. ?You have a fever. ?Get help right away if: ?You have new symptoms, such as vomiting, severe headache, stiff or painful neck, chest pain, or shortness of breath. ?You have severe throat pain, drooling, or changes in your voice. ?You have swelling of the neck, or the skin on the neck becomes red and tender. ?You have signs of dehydration, such as tiredness (fatigue), dry mouth, and decreased urination. ?You become increasingly sleepy, or you cannot wake up completely. ?Your joints become red or painful. ?These symptoms may represent a serious problem that is an emergency. Do not wait to see if the symptoms will go away. Get medical help  right away. Call your local emergency services (911 in the [place] Do not drive yourself to the hospital. ?Summary ?Strep throat is an infection in the throat that is caused by the Streptococcus pyogenes bacteria. This infection is spread from person to person (is contagious) through coughing, sneezing, or having close contact. ?Take your medicines, including antibiotics, as told by your health care provider. Do not stop taking the antibiotic even if you start to feel better. ?To prevent the spread of germs, wash your hands well with soap and water. Have others do the same. Do not share food, drinking cups, or personal items. ?Get help right away if you have new symptoms, such as vomiting, severe headache, stiff or painful neck, chest pain, or shortness of breath. ?This information is not intended to replace advice given to you by your health care provider. Make sure you discuss any questions you have with your health care  provider. ?Document Revised: 04/28/2020 Document Reviewed: 04/28/2020 ?Elsevier Patient Education ? 2022 [place]. ? ? ? ?If you have been instructed to have an in-person evaluation today at a local Urgent

## 2021-06-02 DIAGNOSIS — H5203 Hypermetropia, bilateral: Secondary | ICD-10-CM | POA: Diagnosis not present

## 2021-06-02 DIAGNOSIS — H524 Presbyopia: Secondary | ICD-10-CM | POA: Diagnosis not present

## 2021-06-02 DIAGNOSIS — H26493 Other secondary cataract, bilateral: Secondary | ICD-10-CM | POA: Diagnosis not present

## 2021-06-02 DIAGNOSIS — H52223 Regular astigmatism, bilateral: Secondary | ICD-10-CM | POA: Diagnosis not present

## 2021-06-08 ENCOUNTER — Ambulatory Visit: Payer: BC Managed Care – PPO | Admitting: Medical

## 2021-06-08 VITALS — BP 126/80 | HR 63 | Temp 98.5°F | Resp 18 | Ht 64.0 in | Wt 165.0 lb

## 2021-06-08 DIAGNOSIS — R0683 Snoring: Secondary | ICD-10-CM | POA: Diagnosis not present

## 2021-06-08 DIAGNOSIS — E559 Vitamin D deficiency, unspecified: Secondary | ICD-10-CM

## 2021-06-08 DIAGNOSIS — E782 Mixed hyperlipidemia: Secondary | ICD-10-CM | POA: Diagnosis not present

## 2021-06-08 DIAGNOSIS — R1012 Left upper quadrant pain: Secondary | ICD-10-CM | POA: Diagnosis not present

## 2021-06-08 DIAGNOSIS — R5383 Other fatigue: Secondary | ICD-10-CM

## 2021-06-08 LAB — CBC WITH DIFFERENTIAL/PLATELET
Basophils Absolute: 0 10*3/uL (ref 0.0–0.1)
Basophils Relative: 0.5 % (ref 0.0–3.0)
Eosinophils Absolute: 0.1 10*3/uL (ref 0.0–0.7)
Eosinophils Relative: 1.8 % (ref 0.0–5.0)
HCT: 41.5 % (ref 36.0–46.0)
Hemoglobin: 14 g/dL (ref 12.0–15.0)
Lymphocytes Relative: 38.2 % (ref 12.0–46.0)
Lymphs Abs: 1.8 10*3/uL (ref 0.7–4.0)
MCHC: 33.6 g/dL (ref 30.0–36.0)
MCV: 89.6 fl (ref 78.0–100.0)
Monocytes Absolute: 0.5 10*3/uL (ref 0.1–1.0)
Monocytes Relative: 10.7 % (ref 3.0–12.0)
Neutro Abs: 2.3 10*3/uL (ref 1.4–7.7)
Neutrophils Relative %: 48.8 % (ref 43.0–77.0)
Platelets: 305 10*3/uL (ref 150.0–400.0)
RBC: 4.63 Mil/uL (ref 3.87–5.11)
RDW: 14.6 % (ref 11.5–15.5)
WBC: 4.7 10*3/uL (ref 4.0–10.5)

## 2021-06-08 LAB — COMPREHENSIVE METABOLIC PANEL
ALT: 19 U/L (ref 0–35)
AST: 20 U/L (ref 0–37)
Albumin: 4.2 g/dL (ref 3.5–5.2)
Alkaline Phosphatase: 75 U/L (ref 39–117)
BUN: 13 mg/dL (ref 6–23)
CO2: 34 mEq/L — ABNORMAL HIGH (ref 19–32)
Calcium: 9.6 mg/dL (ref 8.4–10.5)
Chloride: 101 mEq/L (ref 96–112)
Creatinine, Ser: 0.8 mg/dL (ref 0.40–1.20)
GFR: 82.46 mL/min (ref >60.00)
Glucose, Bld: 89 mg/dL (ref 70–99)
Potassium: 3.6 mEq/L (ref 3.5–5.1)
Sodium: 141 mEq/L (ref 135–145)
Total Bilirubin: 0.5 mg/dL (ref 0.2–1.2)
Total Protein: 7 g/dL (ref 6.0–8.3)

## 2021-06-08 LAB — LIPASE: Lipase: 14 U/L (ref 11.0–59.0)

## 2021-06-08 LAB — VITAMIN B12: Vitamin B-12: 635 pg/mL (ref 211–911)

## 2021-06-08 LAB — TSH: TSH: 0.94 u[IU]/mL (ref 0.35–5.50)

## 2021-06-08 LAB — T4, FREE: Free T4: 0.9 ng/dL (ref 0.60–1.60)

## 2021-06-08 LAB — IRON: Iron: 84 ug/dL (ref 42–145)

## 2021-06-08 LAB — VITAMIN D 25 HYDROXY (VIT D DEFICIENCY, FRACTURES): VITD: 22.94 ng/mL — ABNORMAL LOW (ref 30.00–100.00)

## 2021-06-08 MED ORDER — VITAMIN D (ERGOCALCIFEROL) 1.25 MG (50000 UNIT) PO CAPS: 50000.0000 [IU] | ORAL_CAPSULE | ORAL | 0 refills | Status: DC

## 2021-06-08 NOTE — Patient Instructions (Addendum)
For recent fatigue will order fatigue labs listed below.  If above negative then consider referral to sleep medicine to evaluate for sleep apnea due to mild snoring. Do recommend try to not use tylenol pm. But can use melatonin.  For high cholesterol can continue red yeast rice and low cholesterol diet. If risk score increases as discussed would recommend rx statin in future.  For atypical transient luq region pain will follow above labs and add lipase. Also placed order for abdomen US. If [place] negative and pain persists consider cxr with rib series.  Follow up in 2 months or sooner if needed.

## 2021-06-08 NOTE — Progress Notes (Signed)
Subjective:    Patient ID: Joanne Ibarra, female    DOB: 11-13-1965, 56 y.o.   MRN: [id number]  HPI  Pt in for recent fatigue for about one month.   She states never feels 100% rest. Prior labs in past showed lower end b12 and low vit D. Wakes up feeling tired. Pt boyfriend states snores just little bit.   In the past when was on vit D and b12 she did feel better.  Pt states exercising 3-4 times a week.   Elevated cholesterol in past. Pt states does have half sister that had MI. Pt only taking red yeast rice.  Pt notes sharp and transient luq area pain on and off for 2 months. About every other day. Sometimes dull other times sharp. Last for seconds up to Minute.presently pain is dull. Up to date on colonscopy. No rash on skin. No trauma or falls.  Review of Systems  Constitutional:  Negative for chills and fever.  HENT:         Possible low level snoring.  Respiratory:  Negative for apnea, cough, choking, chest tightness and wheezing.   Cardiovascular:  Negative for chest pain and palpitations.  Gastrointestinal:  Negative for abdominal pain, blood in stool and nausea.  Musculoskeletal:  Negative for back pain and neck pain.  Skin:  Negative for color change and rash.  Neurological:  Negative for dizziness, syncope, weakness and numbness.  Psychiatric/Behavioral:  Negative for sleep disturbance. The patient is not nervous/anxious.        Sometimes takes tylenol pm every night before she goes to bed.    Past Medical History:  Diagnosis Date   Allergy    seasonal allergies.   Dysrhythmia    PALPITATIONS YEARS AGO-NO CURRENT PROBLEMS   GERD (gastroesophageal reflux disease)    occasional.   Headache    H/O MIGRAINES   Hypertension    Vaginal dysplasia      Social History   Socioeconomic History   Marital status: Divorced    Spouse name: Not on file   Number of children: Not on file   Years of education: Not on file   Highest education level: Not on file   Occupational History   Not on file  Tobacco Use   Smoking status: Never   Smokeless tobacco: Never  Vaping Use   Vaping Use: Never used  Substance and Sexual Activity   Alcohol use: Yes    Comment: social   Drug use: No   Sexual activity: Yes  Other Topics Concern   Not on file  Social History Narrative   Not on file   Social Determinants of Health   Financial Resource Strain: Not on file  Food Insecurity: Not on file  Transportation Needs: Not on file  Physical Activity: Not on file  Stress: Not on file  Social Connections: Not on file  Intimate Partner Violence: Not on file    Past Surgical History:  Procedure Laterality Date   CERVICAL BIOPSY  W/ LOOP ELECTRODE EXCISION  7253   CO2 LASER APPLICATION N/A 6/64/4034   Procedure: CO2 LASER APPLICATION OF VAGINA, CERVIX;  Surgeon: Lafonda Mosses, MD;  Location: [facility];  Service: Gynecology;  Laterality: N/A;   DILATATION & CURETTAGE/HYSTEROSCOPY WITH MYOSURE N/A 06/19/2015   Procedure: DILATATION & CURETTAGE/ HYSTEROSCOPY ;  Surgeon: Gae Dry, MD;  Location: [facility];  Service: Gynecology;  Laterality: N/A;   LEEP N/A 06/19/2015   Procedure: LOOP ELECTROSURGICAL EXCISION  PROCEDURE (LEEP);  Surgeon: Gae Dry, MD;  Location: [facility];  Service: Gynecology;  Laterality: N/A;   lensectomy Bilateral 2016   for scar tissue build up    Family History  Problem Relation Age of Onset   Asthma Sister    Food Allergy Sister    Asthma Daughter    Diabetes Mother    Hypertension Mother    Cancer Mother        melonoma of vulva   Cancer Father        melanoma   Breast cancer Cousin    Allergic rhinitis Neg Hx    Angioedema Neg Hx    Eczema Neg Hx    Immunodeficiency Neg Hx    Urticaria Neg Hx    Colon cancer Neg Hx    Esophageal cancer Neg Hx    Stomach cancer Neg Hx    Rectal cancer Neg Hx     No Known Allergies  Current Outpatient Medications on File Prior to Visit  Medication  Sig Dispense Refill   fluticasone (FLONASE) 50 MCG/ACT nasal spray SPRAY 2 SPRAYS INTO EACH NOSTRIL EVERY DAY (Patient taking differently: as needed.) 16 mL 5   hydrochlorothiazide (HYDRODIURIL) 25 MG tablet TAKE 1 TABLET BY MOUTH EVERY DAY 30 tablet 11   omeprazole (PRILOSEC) 20 MG capsule Take 20 mg by mouth daily as needed.      Red Yeast Rice Extract (RED YEAST RICE PO) Take by mouth.     UNABLE TO FIND Med Name: magni calium d 3 tabs 1 time day     HYDROcodone bit-homatropine (HYCODAN) 5-1.5 MG/5ML syrup Take 5 mLs by mouth every 6 (six) hours as needed for cough. 120 mL 0   levocetirizine (XYZAL) 5 MG tablet Take 1 tablet (5 mg total) by mouth every evening. 30 tablet 0   Vitamin D, Ergocalciferol, (DRISDOL) 1.25 MG (50000 UNIT) CAPS capsule TAKE 1 CAPSULE BY MOUTH EVERY 7 DAYS 8 capsule 0   No current facility-administered medications on file prior to visit.    BP 126/80   Pulse 63   Temp 98.5 F (36.9 C)   Resp 18   Ht '5\' 4"'$  (1.626 m)   Wt 165 lb (74.8 kg)   LMP 09/28/2013   SpO2 97%   BMI 28.32 kg/m       Objective:   Physical Exam  General Mental Status- Alert. General Appearance- Not in acute distress.   Skin No rash on abdomen  Neck Carotid Arteries- Normal color. Moisture- Normal Moisture. No carotid bruits. No JVD.  Chest and Lung Exam Auscultation: Breath Sounds:-Normal.  Cardiovascular Auscultation:Rythm- Regular. Murmurs & Other Heart Sounds:Auscultation of the heart reveals- No Murmurs.  Abdomen Inspection:-Inspeection Normal. Palpation/Percussion:Note:No mass. Palpation and Percussion of the abdomen reveal- Non Tender on palpation., Non Distended + BS, no rebound or guarding.  Neurologic Cranial Nerve exam:- CN III-XII intact(No nystagmus), symmetric smile. Strength:- 5/5 equal and symmetric strength both upper and lower extremities.       Assessment & Plan:   Patient Instructions  For recent fatigue will order fatigue labs.   If above  negative then consider referral to sleep medicine to evaluate for sleep apnea due to mild snoring. Do recommend try to not use tylenol pm. But can use melatonin.  For high cholesterol can continue red yeast rice and low cholesterol diet. If risk score increases as discussed would recommend rx statin in future.  For atypical transient luq region pain will follow above labs and add  lipase. Also placed order for abdomen US. If [place] negative and pain persists consider cxr with rib series.  Follow up in 2 months or sooner if needed.   Mackie Pai, PA-C

## 2021-06-08 NOTE — Addendum Note (Signed)
Addended by: Anabel Halon on: 06/08/2021 05:28 PM   Modules accepted: Orders

## 2021-06-14 LAB — VITAMIN B1: Vitamin B1 (Thiamine): 8 nmol/L (ref 8–30)

## 2021-07-09 DIAGNOSIS — Z1231 Encounter for screening mammogram for malignant neoplasm of breast: Secondary | ICD-10-CM | POA: Diagnosis not present

## 2021-07-09 DIAGNOSIS — Z124 Encounter for screening for malignant neoplasm of cervix: Secondary | ICD-10-CM | POA: Diagnosis not present

## 2021-07-09 DIAGNOSIS — N952 Postmenopausal atrophic vaginitis: Secondary | ICD-10-CM | POA: Diagnosis not present

## 2021-07-09 DIAGNOSIS — Z6828 Body mass index (BMI) 28.0-28.9, adult: Secondary | ICD-10-CM | POA: Diagnosis not present

## 2021-07-09 DIAGNOSIS — Z87411 Personal history of vaginal dysplasia: Secondary | ICD-10-CM | POA: Diagnosis not present

## 2021-07-09 DIAGNOSIS — Z01419 Encounter for gynecological examination (general) (routine) without abnormal findings: Secondary | ICD-10-CM | POA: Diagnosis not present

## 2021-07-12 DIAGNOSIS — I10 Essential (primary) hypertension: Secondary | ICD-10-CM | POA: Diagnosis not present

## 2021-07-12 DIAGNOSIS — H26491 Other secondary cataract, right eye: Secondary | ICD-10-CM | POA: Diagnosis not present

## 2021-07-12 DIAGNOSIS — Z961 Presence of intraocular lens: Secondary | ICD-10-CM | POA: Diagnosis not present

## 2021-07-21 DIAGNOSIS — H26492 Other secondary cataract, left eye: Secondary | ICD-10-CM | POA: Diagnosis not present

## 2021-07-30 ENCOUNTER — Ambulatory Visit (HOSPITAL_BASED_OUTPATIENT_CLINIC_OR_DEPARTMENT_OTHER): Payer: BC Managed Care – PPO

## 2021-08-02 ENCOUNTER — Other Ambulatory Visit: Payer: Self-pay | Admitting: Medical

## 2021-10-18 DIAGNOSIS — M79671 Pain in right foot: Secondary | ICD-10-CM | POA: Diagnosis not present

## 2021-11-26 ENCOUNTER — Telehealth: Payer: Self-pay | Admitting: Medical

## 2021-11-26 ENCOUNTER — Ambulatory Visit (INDEPENDENT_AMBULATORY_CARE_PROVIDER_SITE_OTHER): Payer: BC Managed Care – PPO | Admitting: Medical

## 2021-11-26 VITALS — BP 130/80 | HR 85 | Temp 98.2°F | Resp 18 | Ht 64.0 in | Wt 175.0 lb

## 2021-11-26 DIAGNOSIS — H6992 Unspecified Eustachian tube disorder, left ear: Secondary | ICD-10-CM

## 2021-11-26 DIAGNOSIS — Z0000 Encounter for general adult medical examination without abnormal findings: Secondary | ICD-10-CM | POA: Diagnosis not present

## 2021-11-26 DIAGNOSIS — E559 Vitamin D deficiency, unspecified: Secondary | ICD-10-CM | POA: Diagnosis not present

## 2021-11-26 DIAGNOSIS — J0100 Acute maxillary sinusitis, unspecified: Secondary | ICD-10-CM

## 2021-11-26 LAB — CBC WITH DIFFERENTIAL/PLATELET
Basophils Absolute: 0 10*3/uL (ref 0.0–0.1)
Basophils Relative: 0.5 % (ref 0.0–3.0)
Eosinophils Absolute: 0.1 10*3/uL (ref 0.0–0.7)
Eosinophils Relative: 1.9 % (ref 0.0–5.0)
HCT: 43.1 % (ref 36.0–46.0)
Hemoglobin: 14.3 g/dL (ref 12.0–15.0)
Lymphocytes Relative: 27.8 % (ref 12.0–46.0)
Lymphs Abs: 1.8 10*3/uL (ref 0.7–4.0)
MCHC: 33.2 g/dL (ref 30.0–36.0)
MCV: 90.4 fl (ref 78.0–100.0)
Monocytes Absolute: 0.8 10*3/uL (ref 0.1–1.0)
Monocytes Relative: 12.4 % — ABNORMAL HIGH (ref 3.0–12.0)
Neutro Abs: 3.7 10*3/uL (ref 1.4–7.7)
Neutrophils Relative %: 57.4 % (ref 43.0–77.0)
Platelets: 308 10*3/uL (ref 150.0–400.0)
RBC: 4.77 Mil/uL (ref 3.87–5.11)
RDW: 13.8 % (ref 11.5–15.5)
WBC: 6.5 10*3/uL (ref 4.0–10.5)

## 2021-11-26 LAB — LIPID PANEL
Cholesterol: 248 mg/dL — ABNORMAL HIGH (ref 0–200)
HDL: 62 mg/dL (ref >39.00)
NonHDL: 186.31
Total CHOL/HDL Ratio: 4
Triglycerides: 236 mg/dL — ABNORMAL HIGH (ref 0.0–149.0)
VLDL: 47.2 mg/dL — ABNORMAL HIGH (ref 0.0–40.0)

## 2021-11-26 LAB — COMPREHENSIVE METABOLIC PANEL
ALT: 24 U/L (ref 0–35)
AST: 23 U/L (ref 0–37)
Albumin: 4.3 g/dL (ref 3.5–5.2)
Alkaline Phosphatase: 84 U/L (ref 39–117)
BUN: 12 mg/dL (ref 6–23)
CO2: 33 mEq/L — ABNORMAL HIGH (ref 19–32)
Calcium: 9.3 mg/dL (ref 8.4–10.5)
Chloride: 100 mEq/L (ref 96–112)
Creatinine, Ser: 0.81 mg/dL (ref 0.40–1.20)
GFR: 80.97 mL/min (ref >60.00)
Glucose, Bld: 69 mg/dL — ABNORMAL LOW (ref 70–99)
Potassium: 3.7 mEq/L (ref 3.5–5.1)
Sodium: 139 mEq/L (ref 135–145)
Total Bilirubin: 0.3 mg/dL (ref 0.2–1.2)
Total Protein: 7.3 g/dL (ref 6.0–8.3)

## 2021-11-26 LAB — LDL CHOLESTEROL, DIRECT: Direct LDL: 156 mg/dL

## 2021-11-26 LAB — VITAMIN D 25 HYDROXY (VIT D DEFICIENCY, FRACTURES): VITD: 28.52 ng/mL — ABNORMAL LOW (ref 30.00–100.00)

## 2021-11-26 MED ORDER — AMOXICILLIN-POT CLAVULANATE 875-125 MG PO TABS: 1.0000 | ORAL_TABLET | Freq: Two times a day (BID) | ORAL | 0 refills | Status: DC

## 2021-11-26 MED ORDER — HYDROCHLOROTHIAZIDE 25 MG PO TABS: 25.0000 mg | ORAL_TABLET | Freq: Every day | ORAL | 3 refills | Status: DC

## 2021-11-26 NOTE — Telephone Encounter (Signed)
Patient called back regarding her medication request, she would like for it to be sent before the weekend. Please advise.

## 2021-11-26 NOTE — Addendum Note (Signed)
Addended by: Anabel Halon on: 11/26/2021 05:33 PM   Modules accepted: Orders

## 2021-11-26 NOTE — Patient Instructions (Addendum)
For you wellness exam today I have ordered cbc, cmp and lipid panel.  Flu declined. Can get later if you want when not sick.   Vit d level today for deficiency  Recommend exercise and healthy diet.  We will let you know lab results as they come in.  Follow up date appointment will be determined after lab review.    Recent sinus infection and eustachian tube dysfunction following allergy vs uri.  Rx flonase nasal spray and augmentin antibiotic.  Preventive Care 74-57 Years Old, Female Preventive care refers to lifestyle choices and visits with your health care provider that can promote health and wellness. Preventive care visits are also called wellness exams. What can I expect for my preventive care visit? Counseling Your health care provider may ask you questions about your: Medical history, including: Past medical problems. Family medical history. Pregnancy history. Current health, including: Menstrual cycle. Method of birth control. Emotional well-being. Home life and relationship well-being. Sexual activity and sexual health. Lifestyle, including: Alcohol, nicotine or tobacco, and drug use. Access to firearms. Diet, exercise, and sleep habits. Work and work Statistician. Sunscreen use. Safety issues such as seatbelt and bike helmet use. Physical exam Your health care provider will check your: Height and weight. These may be used to calculate your BMI (body mass index). BMI is a measurement that tells if you are at a healthy weight. Waist circumference. This measures the distance around your waistline. This measurement also tells if you are at a healthy weight and may help predict your risk of certain diseases, such as type 2 diabetes and high blood pressure. Heart rate and blood pressure. Body temperature. Skin for abnormal spots. What immunizations do I need?  Vaccines are usually given at various ages, according to a schedule. Your health care provider will  recommend vaccines for you based on your age, medical history, and lifestyle or other factors, such as travel or where you work. What tests do I need? Screening Your health care provider may recommend screening tests for certain conditions. This may include: Lipid and cholesterol levels. Diabetes screening. This is done by checking your blood sugar (glucose) after you have not eaten for a while (fasting). Pelvic exam and Pap test. Hepatitis B test. Hepatitis C test. HIV (human immunodeficiency virus) test. STI (sexually transmitted infection) testing, if you are at risk. Lung cancer screening. Colorectal cancer screening. Mammogram. Talk with your health care provider about when you should start having regular mammograms. This may depend on whether you have a family history of breast cancer. BRCA-related cancer screening. This may be done if you have a family history of breast, ovarian, tubal, or peritoneal cancers. Bone density scan. This is done to screen for osteoporosis. Talk with your health care provider about your test results, treatment options, and if necessary, the need for more tests. Follow these instructions at home: Eating and drinking  Eat a diet that includes fresh fruits and vegetables, whole grains, lean protein, and low-fat dairy products. Take vitamin and mineral supplements as recommended by your health care provider. Do not drink alcohol if: Your health care provider tells you not to drink. You are pregnant, may be pregnant, or are planning to become pregnant. If you drink alcohol: Limit how much you have to 0-1 drink a day. Know how much alcohol is in your drink. In the [place] one drink equals one 12 oz bottle of beer (355 mL), one 5 oz glass of wine (148 mL), or one 1 oz glass of  hard liquor (44 mL). Lifestyle Brush your teeth every morning and night with fluoride toothpaste. Floss one time each day. Exercise for at least 30 minutes 5 or more days each week. Do  not use any products that contain nicotine or tobacco. These products include cigarettes, chewing tobacco, and vaping devices, such as e-cigarettes. If you need help quitting, ask your health care provider. Do not use drugs. If you are sexually active, practice safe sex. Use a condom or other form of protection to prevent STIs. If you do not wish to become pregnant, use a form of birth control. If you plan to become pregnant, see your health care provider for a prepregnancy visit. Take aspirin only as told by your health care provider. Make sure that you understand how much to take and what form to take. Work with your health care provider to find out whether it is safe and beneficial for you to take aspirin daily. Find healthy ways to manage stress, such as: Meditation, yoga, or listening to music. Journaling. Talking to a trusted person. Spending time with friends and family. Minimize exposure to UV radiation to reduce your risk of skin cancer. Safety Always wear your seat belt while driving or riding in a vehicle. Do not drive: If you have been drinking alcohol. Do not ride with someone who has been drinking. When you are tired or distracted. While texting. If you have been using any mind-altering substances or drugs. Wear a helmet and other protective equipment during sports activities. If you have firearms in your house, make sure you follow all gun safety procedures. Seek help if you have been physically or sexually abused. What's next? Visit your health care provider once a year for an annual wellness visit. Ask your health care provider how often you should have your eyes and teeth checked. Stay up to date on all vaccines. This information is not intended to replace advice given to you by your health care provider. Make sure you discuss any questions you have with your health care provider. Document Revised: 07/01/2020 Document Reviewed: 07/01/2020 Elsevier Patient Education  [place].

## 2021-11-26 NOTE — Telephone Encounter (Signed)
Patient said she was supposed to receive augmentin but the pharmacy does not have it . Please send prescription for patient to pick up.

## 2021-11-26 NOTE — Addendum Note (Signed)
Addended by: Anabel Halon on: 11/26/2021 05:26 PM   Modules accepted: Orders

## 2021-11-26 NOTE — Addendum Note (Signed)
Addended by: Anabel Halon on: 11/26/2021 10:12 AM   Modules accepted: Orders

## 2021-11-26 NOTE — Progress Notes (Signed)
Subjective:    Patient ID: Joanne Ibarra, female    DOB: 31-Dec-1965, 56 y.o.   MRN: [id number]  HPI   Here for wellness exam.  Pt now working in mental health/therapist. Pt has not been exercising. Pt states eating healthy. Non smoker. Rare alcohol use. Drinks decaffeinated coffee.   Declined flu vaccine today and current illness.  Up to date on mammogram, colonoscopy and pap.    Review of Systems  HENT:  Positive for congestion, postnasal drip, rhinorrhea, sinus pressure and voice change. Negative for ear pain.        Past Thursday one week ago started. Pt thought allergies. Pt tried allergra but did not help much. Sudafed helped more.  Respiratory:  Positive for cough. Negative for chest tightness and wheezing.        Cough for one day.  Cardiovascular:  Negative for chest pain and palpitations.  Gastrointestinal:  Negative for abdominal pain, blood in stool and diarrhea.  Genitourinary:  Negative for dysuria and frequency.  Musculoskeletal:  Negative for back pain, joint swelling and myalgias.  Neurological:  Negative for dizziness, speech difficulty and headaches.  Hematological:  Negative for adenopathy. Does not bruise/bleed easily.  Psychiatric/Behavioral:  Negative for behavioral problems, decreased concentration and hallucinations. The patient is not nervous/anxious.     Past Medical History:  Diagnosis Date   Allergy    seasonal allergies.   Dysrhythmia    PALPITATIONS YEARS AGO-NO CURRENT PROBLEMS   GERD (gastroesophageal reflux disease)    occasional.   Headache    H/O MIGRAINES   Hypertension    Vaginal dysplasia      Social History   Socioeconomic History   Marital status: Divorced    Spouse name: Not on file   Number of children: Not on file   Years of education: Not on file   Highest education level: Not on file  Occupational History   Not on file  Tobacco Use   Smoking status: Never   Smokeless tobacco: Never  Vaping Use   Vaping Use:  Never used  Substance and Sexual Activity   Alcohol use: Yes    Comment: social   Drug use: No   Sexual activity: Yes  Other Topics Concern   Not on file  Social History Narrative   Not on file   Social Determinants of Health   Financial Resource Strain: Not on file  Food Insecurity: Not on file  Transportation Needs: Not on file  Physical Activity: Not on file  Stress: Not on file  Social Connections: Not on file  Intimate Partner Violence: Not on file    Past Surgical History:  Procedure Laterality Date   CERVICAL BIOPSY  W/ LOOP ELECTRODE EXCISION  3151   CO2 LASER APPLICATION N/A 7/61/6073   Procedure: CO2 LASER APPLICATION OF VAGINA, CERVIX;  Surgeon: Lafonda Mosses, MD;  Location: [place];  Service: Gynecology;  Laterality: N/A;   DILATATION & CURETTAGE/HYSTEROSCOPY WITH MYOSURE N/A 06/19/2015   Procedure: DILATATION & CURETTAGE/ HYSTEROSCOPY ;  Surgeon: Gae Dry, MD;  Location: [facility];  Service: Gynecology;  Laterality: N/A;   LEEP N/A 06/19/2015   Procedure: LOOP ELECTROSURGICAL EXCISION PROCEDURE (LEEP);  Surgeon: Gae Dry, MD;  Location: [facility];  Service: Gynecology;  Laterality: N/A;   lensectomy Bilateral 2016   for scar tissue build up    Family History  Problem Relation Age of Onset   Asthma Sister    Food Allergy Sister  Asthma Daughter    Diabetes Mother    Hypertension Mother    Cancer Mother        melonoma of vulva   Cancer Father        melanoma   Breast cancer Cousin    Allergic rhinitis Neg Hx    Angioedema Neg Hx    Eczema Neg Hx    Immunodeficiency Neg Hx    Urticaria Neg Hx    Colon cancer Neg Hx    Esophageal cancer Neg Hx    Stomach cancer Neg Hx    Rectal cancer Neg Hx     No Known Allergies  Current Outpatient Medications on File Prior to Visit  Medication Sig Dispense Refill   fluticasone (FLONASE) 50 MCG/ACT nasal spray SPRAY 2 SPRAYS INTO EACH NOSTRIL EVERY DAY (Patient taking  differently: as needed.) 16 mL 5   hydrochlorothiazide (HYDRODIURIL) 25 MG tablet TAKE 1 TABLET BY MOUTH EVERY DAY 30 tablet 11   omeprazole (PRILOSEC) 20 MG capsule Take 20 mg by mouth daily as needed.      Red Yeast Rice Extract (RED YEAST RICE PO) Take by mouth.     UNABLE TO FIND Med Name: magni calium d 3 tabs 1 time day     Vitamin D, Ergocalciferol, (DRISDOL) 1.25 MG (50000 UNIT) CAPS capsule TAKE 1 CAPSULE BY MOUTH EVERY 7 DAYS 8 capsule 0   No current facility-administered medications on file prior to visit.    BP 130/80   Pulse 85   Temp 98.2 F (36.8 C)   Resp 18   Ht '5\' 4"'$  (1.626 m)   Wt 175 lb (79.4 kg)   LMP 09/28/2013   SpO2 96%   BMI 30.04 kg/m        Objective:   Physical Exam  General Mental Status- Alert. General Appearance- Not in acute distress.   Skin General: Color- Normal Color. Moisture- Normal Moisture.  Neck Carotid Arteries- Normal color. Moisture- Normal Moisture. No carotid bruits. No JVD.  Chest and Lung Exam Auscultation: Breath Sounds:-Normal.  Cardiovascular Auscultation:Rythm- Regular. Murmurs & Other Heart Sounds:Auscultation of the heart reveals- No Murmurs.  Abdomen Inspection:-Inspeection Normal. Palpation/Percussion:Note:No mass. Palpation and Percussion of the abdomen reveal- Non Tender, Non Distended + BS, no rebound or guarding.    Neurologic Cranial Nerve exam:- CN III-XII intact(No nystagmus), symmetric smile. Drift Test:- No drift. Romberg Exam:- Negative.  Heal to Toe Gait exam:-Normal. Finger to Nose:- Normal/Intact Strength:- 5/5 equal and symmetric strength both upper and lower extremities.    Heent- maxillary sinus pressure. Canals clear and normal tms.      Assessment & Plan:    For you wellness exam today I have ordered cbc, cmp and lipid panel.  Flu declined. Can get later if you want when not sick.   Vit d level today for deficiency  Recommend exercise and healthy diet.  We will let you  know lab results as they come in.  Follow up date appointment will be determined after lab review.    Recent sinus infection and eustachian tube dysfunction following allergy vs uri.  Rx flonase nasal spray and augmentin antibiotic.    Mackie Pai, PA-C

## 2022-03-18 ENCOUNTER — Ambulatory Visit (HOSPITAL_BASED_OUTPATIENT_CLINIC_OR_DEPARTMENT_OTHER)
Admission: RE | Admit: 2022-03-18 | Discharge: 2022-03-18 | Disposition: A | Discharge status: Home / Self Care | Payer: PRIVATE HEALTH INSURANCE | Source: Ambulatory Visit | Attending: Medical | Admitting: Medical

## 2022-03-18 ENCOUNTER — Ambulatory Visit: Payer: PRIVATE HEALTH INSURANCE | Admitting: Medical

## 2022-03-18 VITALS — BP 128/74 | HR 62 | Temp 98.0°F | Ht 64.0 in | Wt 178.0 lb

## 2022-03-18 DIAGNOSIS — E785 Hyperlipidemia, unspecified: Secondary | ICD-10-CM

## 2022-03-18 DIAGNOSIS — R0781 Pleurodynia: Secondary | ICD-10-CM

## 2022-03-18 DIAGNOSIS — R1031 Right lower quadrant pain: Secondary | ICD-10-CM | POA: Diagnosis not present

## 2022-03-18 DIAGNOSIS — G4700 Insomnia, unspecified: Secondary | ICD-10-CM

## 2022-03-18 LAB — CBC WITH DIFFERENTIAL/PLATELET
Basophils Absolute: 0 10*3/uL (ref 0.0–0.1)
Basophils Relative: 0.8 % (ref 0.0–3.0)
Eosinophils Absolute: 0.1 10*3/uL (ref 0.0–0.7)
Eosinophils Relative: 2.9 % (ref 0.0–5.0)
HCT: 40.8 % (ref 36.0–46.0)
Hemoglobin: 14.1 g/dL (ref 12.0–15.0)
Lymphocytes Relative: 46.4 % — ABNORMAL HIGH (ref 12.0–46.0)
Lymphs Abs: 2.2 10*3/uL (ref 0.7–4.0)
MCHC: 34.7 g/dL (ref 30.0–36.0)
MCV: 89.2 fl (ref 78.0–100.0)
Monocytes Absolute: 0.6 10*3/uL (ref 0.1–1.0)
Monocytes Relative: 11.7 % (ref 3.0–12.0)
Neutro Abs: 1.8 10*3/uL (ref 1.4–7.7)
Neutrophils Relative %: 38.2 % — ABNORMAL LOW (ref 43.0–77.0)
Platelets: 319 10*3/uL (ref 150.0–400.0)
RBC: 4.58 Mil/uL (ref 3.87–5.11)
RDW: 14 % (ref 11.5–15.5)
WBC: 4.8 10*3/uL (ref 4.0–10.5)

## 2022-03-18 LAB — COMPREHENSIVE METABOLIC PANEL
ALT: 26 U/L (ref 0–35)
AST: 24 U/L (ref 0–37)
Albumin: 3.9 g/dL (ref 3.5–5.2)
Alkaline Phosphatase: 79 U/L (ref 39–117)
BUN: 13 mg/dL (ref 6–23)
CO2: 31 mEq/L (ref 19–32)
Calcium: 9.4 mg/dL (ref 8.4–10.5)
Chloride: 103 mEq/L (ref 96–112)
Creatinine, Ser: 0.83 mg/dL (ref 0.40–1.20)
GFR: 78.46 mL/min (ref >60.00)
Glucose, Bld: 73 mg/dL (ref 70–99)
Potassium: 3.8 mEq/L (ref 3.5–5.1)
Sodium: 141 mEq/L (ref 135–145)
Total Bilirubin: 0.3 mg/dL (ref 0.2–1.2)
Total Protein: 7 g/dL (ref 6.0–8.3)

## 2022-03-18 LAB — LIPASE: Lipase: 21 U/L (ref 11.0–59.0)

## 2022-03-18 MED ORDER — TRAZODONE HCL 50 MG PO TABS: 25.0000 mg | ORAL_TABLET | Freq: Every evening | ORAL | 0 refills | Status: DC | PRN

## 2022-03-18 NOTE — Patient Instructions (Addendum)
1. Right lower quadrant abdominal pain Eat healthy foods as discussed.  Follow labs and [place] results. Recent pain up to 8/10  but now pain level 1/10 at best - CBC w/Diff - Comp Met (CMET) - Lipase [place] RUQ pain.  2. Rib pain(pain was recenlty in rt rib area so will get xray. - DG Ribs Unilateral Right.   Ask you send me copy of your most recent mammogram.  High cholesterol- diet and exercise presently.  For insomnia rx trazadone.  Follow up date to be determined after lab review.

## 2022-03-18 NOTE — Progress Notes (Signed)
Subjective:    Patient ID: Joanne Ibarra, female    DOB: 06-Apr-1965, 57 y.o.   MRN: [id number]  HPI  Pt in recent rt side abdomen pain that last for about 2 weeks. This week pain has subsided/almost gone completely. No nausea or vomiting. Normal appetitie. No correlation with food.   Pain level was 8/10. Pain was rt upper quadrant and rx rib area below axillary area.  Up to date on colonocopy.  Pt states last mammogram in 6- 2023 was normal.  Pt has not drank any alcohol recently.   Pt has insomnia for years. Pt has been using tylenol pm for. Failed melatonin in past.  Hyperlipidemia- discussed high cholesterol.    Review of Systems  Constitutional:  Negative for chills, fatigue and fever.  HENT:  Negative for congestion, ear discharge, ear pain and facial swelling.   Respiratory:  Negative for cough, chest tightness, shortness of breath and wheezing.   Cardiovascular:  Negative for chest pain and palpitations.  Gastrointestinal:  Positive for abdominal pain. Negative for blood in stool and nausea.       Rt side abdomen pain.  Genitourinary:  Negative for dysuria and flank pain.  Musculoskeletal:  Negative for back pain, joint swelling and myalgias.  Neurological:  Negative for dizziness and headaches.  Psychiatric/Behavioral:  Negative for behavioral problems and confusion.     Past Medical History:  Diagnosis Date   Allergy    seasonal allergies.   Dysrhythmia    PALPITATIONS YEARS AGO-NO CURRENT PROBLEMS   GERD (gastroesophageal reflux disease)    occasional.   Headache    H/O MIGRAINES   Hypertension    Vaginal dysplasia      Social History   Socioeconomic History   Marital status: Divorced    Spouse name: Not on file   Number of children: Not on file   Years of education: Not on file   Highest education level: Not on file  Occupational History   Not on file  Tobacco Use   Smoking status: Never   Smokeless tobacco: Never  Vaping Use   Vaping  Use: Never used  Substance and Sexual Activity   Alcohol use: Yes    Comment: social   Drug use: No   Sexual activity: Yes  Other Topics Concern   Not on file  Social History Narrative   Not on file   Social Determinants of Health   Financial Resource Strain: Not on file  Food Insecurity: Not on file  Transportation Needs: Not on file  Physical Activity: Not on file  Stress: Not on file  Social Connections: Not on file  Intimate Partner Violence: Not on file    Past Surgical History:  Procedure Laterality Date   CERVICAL BIOPSY  W/ LOOP ELECTRODE EXCISION  123456   CO2 LASER APPLICATION N/A [id number]   Procedure: CO2 LASER APPLICATION OF VAGINA, CERVIX;  Surgeon: Lafonda Mosses, MD;  Location: [place];  Service: Gynecology;  Laterality: N/A;   DILATATION & CURETTAGE/HYSTEROSCOPY WITH MYOSURE N/A 06/19/2015   Procedure: DILATATION & CURETTAGE/ HYSTEROSCOPY ;  Surgeon: Gae Dry, MD;  Location: [facility];  Service: Gynecology;  Laterality: N/A;   LEEP N/A 06/19/2015   Procedure: LOOP ELECTROSURGICAL EXCISION PROCEDURE (LEEP);  Surgeon: Gae Dry, MD;  Location: [facility];  Service: Gynecology;  Laterality: N/A;   lensectomy Bilateral 2016   for scar tissue build up    Family History  Problem Relation Age of Onset  Asthma Sister    Food Allergy Sister    Asthma Daughter    Diabetes Mother    Hypertension Mother    Cancer Mother        melonoma of vulva   Cancer Father        melanoma   Breast cancer Cousin    Allergic rhinitis Neg Hx    Angioedema Neg Hx    Eczema Neg Hx    Immunodeficiency Neg Hx    Urticaria Neg Hx    Colon cancer Neg Hx    Esophageal cancer Neg Hx    Stomach cancer Neg Hx    Rectal cancer Neg Hx     No Known Allergies  Current Outpatient Medications on File Prior to Visit  Medication Sig Dispense Refill   fluticasone (FLONASE) 50 MCG/ACT nasal spray SPRAY 2 SPRAYS INTO EACH NOSTRIL EVERY DAY (Patient taking  differently: as needed.) 16 mL 5   hydrochlorothiazide (HYDRODIURIL) 25 MG tablet Take 1 tablet (25 mg total) by mouth daily. 90 tablet 3   omeprazole (PRILOSEC) 20 MG capsule Take 20 mg by mouth daily as needed.      No current facility-administered medications on file prior to visit.    BP 128/74   Pulse 62   Temp 98 F (36.7 C)   Ht '5\' 4"'$  (1.626 m)   Wt 178 lb (80.7 kg)   LMP 09/28/2013   SpO2 95%   BMI 30.55 kg/m        Objective:   Physical Exam  General Mental Status- Alert. General Appearance- Not in acute distress.   Skin General: Color- Normal Color. Moisture- Normal Moisture.  Neck Carotid Arteries- Normal color. Moisture- Normal Moisture. No carotid bruits. No JVD.  Chest and Lung Exam Auscultation: Breath Sounds:-Normal.  Cardiovascular Auscultation:Rythm- Regular. Murmurs & Other Heart Sounds:Auscultation of the heart reveals- No Murmurs.  Abdomen Inspection:-Inspeection Normal. Palpation/Percussion:Note:No mass. Palpation and Percussion of the abdomen reveal- Non Tender, Non Distended + BS, no rebound or guarding.  Neurologic Cranial Nerve exam:- CN III-XII intact(No nystagmus), symmetric smile. Strength:- 5/5 equal and symmetric strength both upper and lower extremities.       Assessment & Plan:   Patient Instructions  1. Right lower quadrant abdominal pain Eat healthy foods as discussed.  Follow labs and [place] results. Recent pain up to 8/10  but now pain level 1/10 at best - CBC w/Diff - Comp Met (CMET) - Lipase [place] RUQ pain.  2. Rib pain(pain was recenlty in rt rib area so will get xray. - DG Ribs Unilateral Right.   Ask you send me copy of your most recent mammogram.  High cholesterol- diet and exercise presently.  For insomnia rx trazadone.  Follow up date to be determined after lab review.    Mackie Pai, PA-C

## 2022-04-14 ENCOUNTER — Other Ambulatory Visit: Payer: Self-pay | Admitting: Medical

## 2022-05-11 ENCOUNTER — Other Ambulatory Visit: Payer: Self-pay | Admitting: Medical

## 2022-05-16 ENCOUNTER — Ambulatory Visit: Payer: PRIVATE HEALTH INSURANCE | Admitting: Medical

## 2022-05-16 VITALS — BP 126/76 | HR 94 | Temp 99.0°F | Resp 18 | Ht 64.0 in

## 2022-05-16 DIAGNOSIS — R42 Dizziness and giddiness: Secondary | ICD-10-CM | POA: Diagnosis not present

## 2022-05-16 DIAGNOSIS — J029 Acute pharyngitis, unspecified: Secondary | ICD-10-CM | POA: Diagnosis not present

## 2022-05-16 DIAGNOSIS — R5383 Other fatigue: Secondary | ICD-10-CM | POA: Diagnosis not present

## 2022-05-16 LAB — POCT RAPID STREP A (OFFICE): Rapid Strep A Screen: POSITIVE — AB

## 2022-05-16 LAB — POC COVID19 BINAXNOW: SARS Coronavirus 2 Ag: NEGATIVE

## 2022-05-16 LAB — GLUCOSE, POCT (MANUAL RESULT ENTRY): POC Glucose: 116 mg/dl — AB (ref 70–99)

## 2022-05-16 MED ORDER — CEFTRIAXONE SODIUM 1 G IJ SOLR
1.0000 g | Freq: Once | INTRAMUSCULAR | Status: AC
  Administered 2022-05-16: 1 g via INTRAMUSCULAR

## 2022-05-16 MED ORDER — AMOXICILLIN 400 MG/5ML PO SUSR: ORAL | 0 refills | Status: DC

## 2022-05-16 NOTE — Progress Notes (Signed)
Subjective:    Patient ID: Joanne Ibarra, female    DOB: 30-Jun-1965, 57 y.o.   MRN: [id number]  HPI Pt in with sorethroat for one day but fatigued since Friday and Saturday. Last 24 hours has not eaten or drank much. Pt states she was not eating due to pain. Pt has grandaughter 28 yr old that lives with her.   Some body aches.   Rt ear pain for one day.  States right now when swallow feel like razor blades in her throat.  Brought back from waiting as felt light headed and near syncopal. When brought back in office felt better stating room cooler, gave water, OJ and rapid strep came back +.       Review of Systems  Constitutional:  Positive for fatigue. Negative for fever.  HENT:  Positive for sore throat. Negative for congestion, ear discharge, ear pain, postnasal drip and trouble swallowing.   Eyes:  Negative for pain.  Respiratory:  Negative for cough, chest tightness, shortness of breath and wheezing.   Cardiovascular:  Negative for chest pain and palpitations.  Gastrointestinal:  Negative for abdominal pain and anal bleeding.  Genitourinary:  Negative for dyspareunia, enuresis and flank pain.  Musculoskeletal:  Positive for myalgias. Negative for back pain.  Neurological:  Negative for tremors, syncope and facial asymmetry.       Near syncope briefly in waiting room. She states felt very hot, not drank much or eaten today. Also had throat pain on swallowing.  Hematological:  Positive for adenopathy. Does not bruise/bleed easily.  Psychiatric/Behavioral:  Negative for confusion and self-injury.     Past Medical History:  Diagnosis Date   Allergy    seasonal allergies.   Dysrhythmia    PALPITATIONS YEARS AGO-NO CURRENT PROBLEMS   GERD (gastroesophageal reflux disease)    occasional.   Headache    H/O MIGRAINES   Hypertension    Vaginal dysplasia      Social History   Socioeconomic History   Marital status: Divorced    Spouse name: Not on file   Number of  children: Not on file   Years of education: Not on file   Highest education level: Not on file  Occupational History   Not on file  Tobacco Use   Smoking status: Never   Smokeless tobacco: Never  Vaping Use   Vaping Use: Never used  Substance and Sexual Activity   Alcohol use: Yes    Comment: social   Drug use: No   Sexual activity: Yes  Other Topics Concern   Not on file  Social History Narrative   Not on file   Social Determinants of Health   Financial Resource Strain: Not on file  Food Insecurity: Not on file  Transportation Needs: Not on file  Physical Activity: Not on file  Stress: Not on file  Social Connections: Not on file  Intimate Partner Violence: Not on file    Past Surgical History:  Procedure Laterality Date   CERVICAL BIOPSY  W/ LOOP ELECTRODE EXCISION  2003   CO2 LASER APPLICATION N/A 06/05/2019   Procedure: CO2 LASER APPLICATION OF VAGINA, CERVIX;  Surgeon: Carver Fila, MD;  Location: [facility] [place];  Service: Gynecology;  Laterality: N/A;   DILATATION & CURETTAGE/HYSTEROSCOPY WITH MYOSURE N/A 06/19/2015   Procedure: DILATATION & CURETTAGE/ HYSTEROSCOPY ;  Surgeon: Nadara Mustard, MD;  Location: [facility];  Service: Gynecology;  Laterality: N/A;   LEEP N/A 06/19/2015   Procedure: LOOP  ELECTROSURGICAL EXCISION PROCEDURE (LEEP);  Surgeon: Nadara Mustard, MD;  Location: [facility];  Service: Gynecology;  Laterality: N/A;   lensectomy Bilateral 2016   for scar tissue build up    Family History  Problem Relation Age of Onset   Asthma Sister    Food Allergy Sister    Asthma Daughter    Diabetes Mother    Hypertension Mother    Cancer Mother        melonoma of vulva   Cancer Father        melanoma   Breast cancer Cousin    Allergic rhinitis Neg Hx    Angioedema Neg Hx    Eczema Neg Hx    Immunodeficiency Neg Hx    Urticaria Neg Hx    Colon cancer Neg Hx    Esophageal cancer Neg Hx    Stomach cancer Neg Hx    Rectal cancer Neg Hx      No Known Allergies  Current Outpatient Medications on File Prior to Visit  Medication Sig Dispense Refill   fluticasone (FLONASE) 50 MCG/ACT nasal spray SPRAY 2 SPRAYS INTO EACH NOSTRIL EVERY DAY (Patient taking differently: as needed.) 16 mL 5   hydrochlorothiazide (HYDRODIURIL) 25 MG tablet Take 1 tablet (25 mg total) by mouth daily. 90 tablet 3   omeprazole (PRILOSEC) 20 MG capsule Take 20 mg by mouth daily as needed.      traZODone (DESYREL) 50 MG tablet TAKE 1/2 TO 1 TABLET(25 TO 50 MG) BY MOUTH AT BEDTIME AS NEEDED FOR SLEEP 30 tablet 0   No current facility-administered medications on file prior to visit.    BP 126/76   Pulse 94   Temp 99 F (37.2 C)   Resp 18   Ht 5\' 4"  (1.626 m)   LMP 09/28/2013   SpO2 94%   BMI 30.55 kg/m        Objective:   Physical Exam  General Mental Status- Alert. General Appearance- Not in acute distress.   Skin General: Color- Normal Color. Moisture- Normal Moisture.  Neck Carotid Arteries- Normal color. Moisture- Normal Moisture. No carotid bruits. No JVD.  Chest and Lung Exam Auscultation: Breath Sounds:-Normal.  Cardiovascular Auscultation:Rythm- Regular. Murmurs & Other Heart Sounds:Auscultation of the heart reveals- No Murmurs.  Abdomen Inspection:-Inspeection Normal. Palpation/Percussion:Note:No mass. Palpation and Percussion of the abdomen reveal- Non Tender, Non Distended + BS, no rebound or guarding.   Neurologic Cranial Nerve exam:- CN III-XII intact(No nystagmus), symmetric smile. Strength:- 5/5 equal and symmetric strength both upper and lower extremities.   Heent- no sinus pressure, canals clear and tm normal. Posteior pharynx. Shows beefy red posterior pharynx with moderate enlarged uvula. No exudate seen.    Assessment & Plan:   Patient Instructions  Recent sore throat for 1 day preceded by  2 to 3 days of fatigue.  Rapid strep test came back positive today in office.  COVID test came back negative.   Near syncopal episode in waiting room.  I think he had near vasovagal type  as you report feeling hot with a moderate to severe sore throat and may be dehydrated to some degree having not drank much over the last 2 days.  We gave 1 g Rocephin today.  Thought best to give injection treatment since it hurts to swallow.  Starting tomorrow could use amoxicillin suspension.  Also use ibuprofen over-the-counter 200 mg tablets 3 to 4 tablets every 8 hours as needed throat pain.  Monitored today in office and after injection  to make sure you feel better/not light headed.  Able to stand without feeling lightheaded or having a near syncope. You feel it is safe for you to drive home.  Short distance.  Recommend that you hydrate well with propel tonight and tomorrow.  Eat a bland soft foods that are easy to swallow.  Follow-up in 7 days or sooner if needed.   Esperanza Richters, PA-C

## 2022-05-16 NOTE — Patient Instructions (Signed)
Recent sore throat for 1 day preceded by  2 to 3 days of fatigue.  Rapid strep test came back positive today in office.  COVID test came back negative.  Near syncopal episode in waiting room.  I think he had near vasovagal type  as you report feeling hot with a moderate to severe sore throat and may be dehydrated to some degree having not drank much over the last 2 days.  We gave 1 g Rocephin today.  Thought best to give injection treatment since it hurts to swallow.  Starting tomorrow could use amoxicillin suspension.  Also use ibuprofen over-the-counter 200 mg tablets 3 to 4 tablets every 8 hours as needed throat pain.  Monitored today in office and after injection to make sure you feel better/not light headed.  Able to stand without feeling lightheaded or having a near syncope. You feel it is safe for you to drive home.  Short distance.  Recommend that you hydrate well with propel tonight and tomorrow.  Eat a bland soft foods that are easy to swallow.  Follow-up in 7 days or sooner if needed.

## 2022-05-18 LAB — CULTURE, GROUP A STREP
MICRO NUMBER:: 14886612
SPECIMEN QUALITY:: ADEQUATE

## 2022-06-08 ENCOUNTER — Other Ambulatory Visit: Payer: Self-pay | Admitting: Medical

## 2022-11-02 IMAGING — MG MM DIGITAL DIAGNOSTIC UNILAT*R* W/ TOMO W/ CAD
4 series · 4 of 12 positions shown · non-contrast
Comparison: Previous exam(s).

CLINICAL DATA: Patient recalled from screening for right breast
asymmetry.

EXAM:
DIGITAL DIAGNOSTIC UNILATERAL RIGHT MAMMOGRAM WITH TOMOSYNTHESIS AND
CAD
TECHNIQUE: Right digital diagnostic mammography and breast tomosynthesis was
performed. The images were evaluated with computer-aided detection.

[R CC synth-2D]
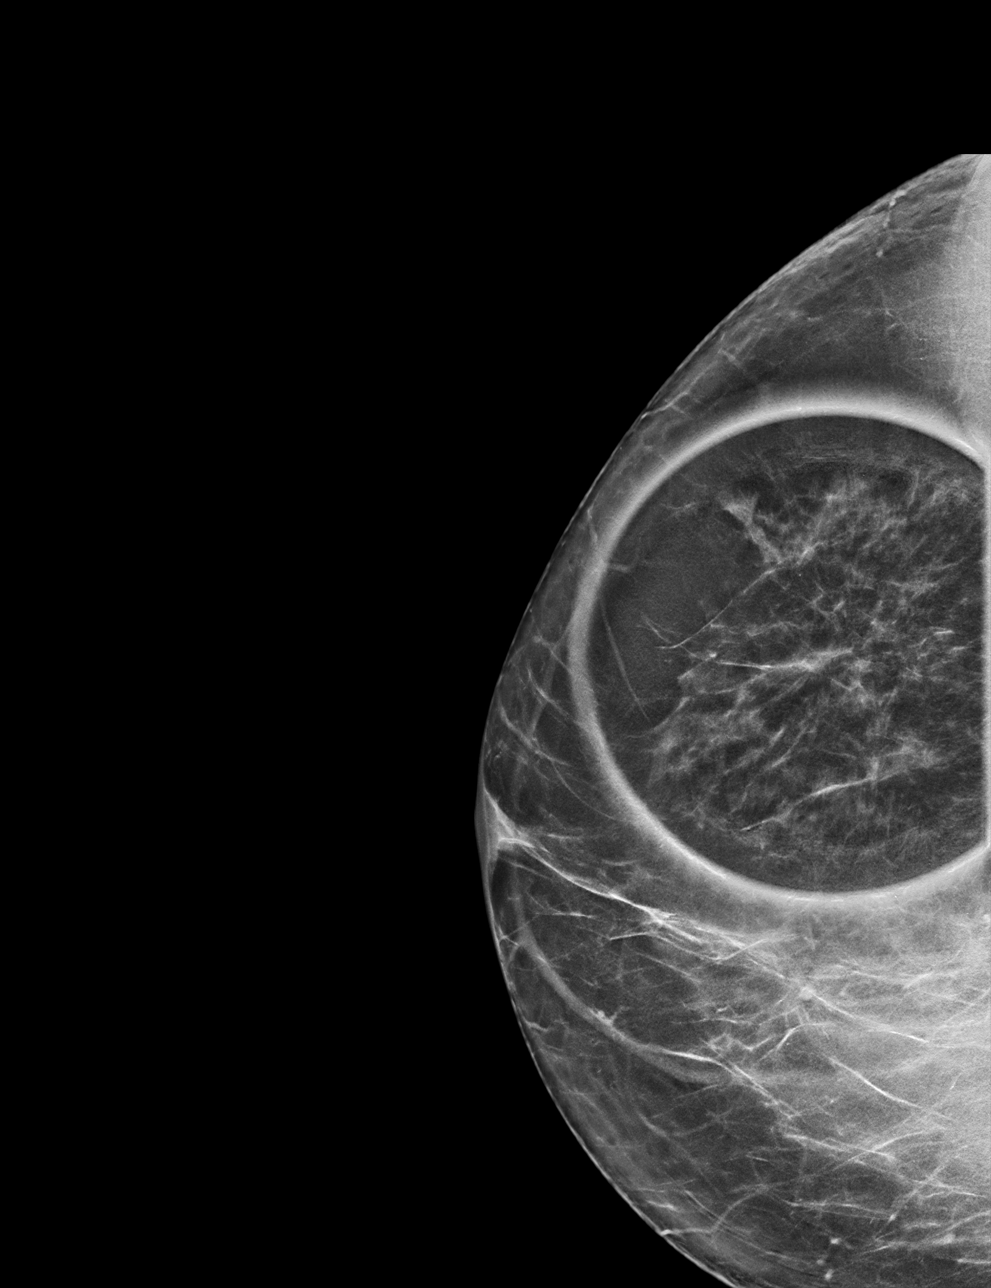

[R ML synth-2D]
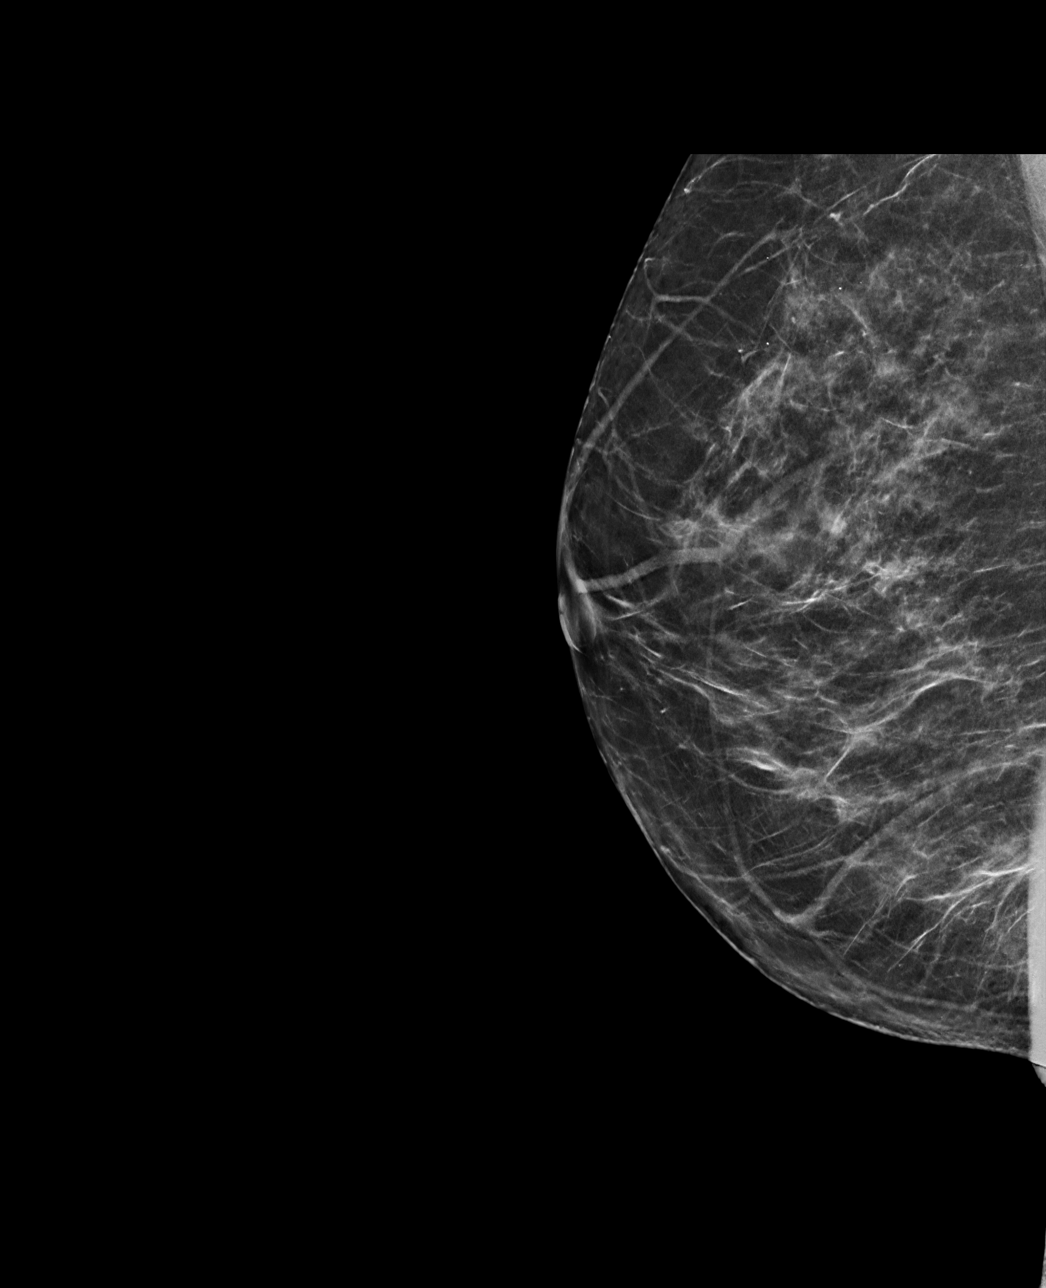

[R CC tomo · tomo slice 33/64.0]
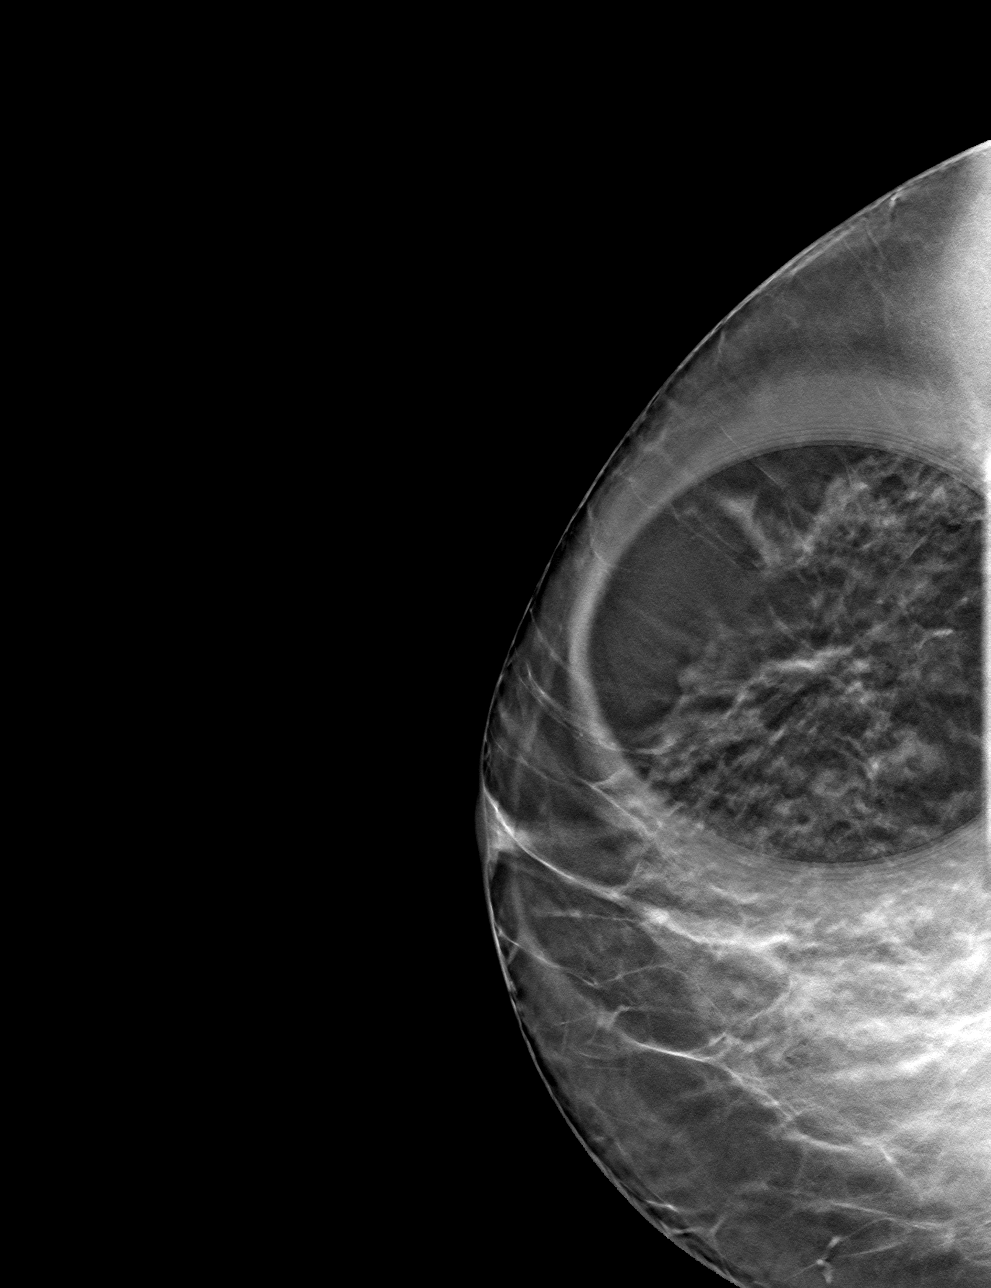

[R ML tomo · tomo slice 34/67.0]
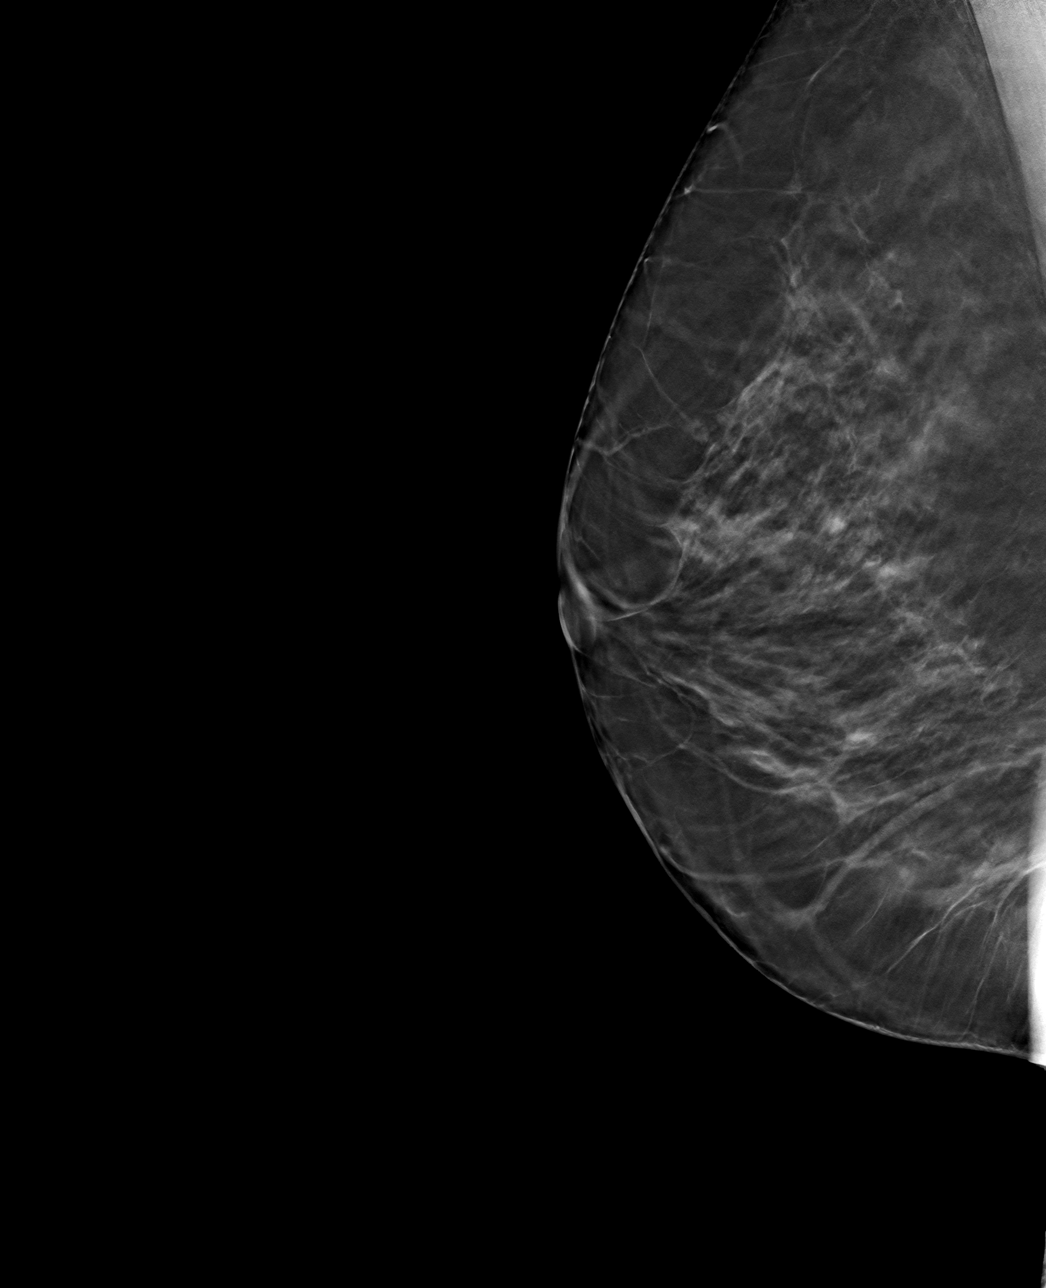

[4 of 12 positions shown; findings below may reference images not displayed]

ACR Breast Density Category c: The breast tissue is heterogeneously
dense, which may obscure small masses.
FINDINGS: Questioned asymmetry within the outer right breast resolved with
additional imaging compatible with dense overlapping fibroglandular
tissue. No suspicious findings.
IMPRESSION: No mammographic evidence for malignancy.

RECOMMENDATION:
Screening mammogram in one year.(Code:C9-Q-7YP)

I have discussed the findings and recommendations with the patient.
If applicable, a reminder letter will be sent to the patient
regarding the next appointment.

BI-RADS CATEGORY  1: Negative.

## 2022-11-10 ENCOUNTER — Other Ambulatory Visit: Payer: Self-pay | Admitting: Medical

## 2023-01-12 ENCOUNTER — Encounter: Payer: Self-pay | Admitting: Medical

## 2023-01-12 ENCOUNTER — Telehealth: Payer: PRIVATE HEALTH INSURANCE | Admitting: Medical

## 2023-01-12 VITALS — BP 140/87 | HR 62 | Temp 97.7°F | Resp 18 | Ht 64.0 in | Wt 178.6 lb

## 2023-01-12 DIAGNOSIS — H9313 Tinnitus, bilateral: Secondary | ICD-10-CM | POA: Diagnosis not present

## 2023-01-12 DIAGNOSIS — K219 Gastro-esophageal reflux disease without esophagitis: Secondary | ICD-10-CM

## 2023-01-12 DIAGNOSIS — I10 Essential (primary) hypertension: Secondary | ICD-10-CM | POA: Diagnosis not present

## 2023-01-12 DIAGNOSIS — Z1231 Encounter for screening mammogram for malignant neoplasm of breast: Secondary | ICD-10-CM

## 2023-01-12 MED ORDER — HYDROCHLOROTHIAZIDE 25 MG PO TABS: 25.0000 mg | ORAL_TABLET | Freq: Every day | ORAL | 3 refills | Status: DC

## 2023-01-12 MED ORDER — OMEPRAZOLE 20 MG PO CPDR: 20.0000 mg | DELAYED_RELEASE_CAPSULE | Freq: Every day | ORAL | 3 refills | Status: DC

## 2023-01-12 NOTE — Progress Notes (Signed)
Subjective:    Patient ID: Joanne Ibarra, female    DOB: [birth year], 57 y.o.   MRN: [id number]  HPI  Discussed the use of AI scribe software for clinical note transcription with the patient, who gave verbal consent to proceed.  History of Present Illness   The patient, a psychotherapist, presents with a chief complaint of bilateral tinnitus that has been progressively worsening over the past five to six months. The patient describes the symptom as a low-level, daily ringing in both ears, which is currently ongoing. The tinnitus is so severe that when her four-year-old granddaughter speaks close to her ear, it sounds amplified, as if through a loudspeaker. The patient attempted to alleviate the symptoms by cleaning her ears at home, suspecting wax buildup, but noticed no significant difference in the tinnitus or the amount of wax removed.  The patient also reports a recent increase in blood pressure, which she attributes to missing a dose of her blood pressure medication, hydrochlorothiazide, the day before the consultation. She notes that her blood pressure has generally been well-controlled on this medication, and she resumed taking it on the day of the consultation.  In addition, the patient mentions occasional heartburn, which has been on and off since Thanksgiving. She has been taking over-the-counter omeprazole as needed, but not regularly due to the cost. She expresses a desire for a prescription for this medication.  The patient denies any recent exposure to loud noises, nasal congestion, sneezing, itchy eyes, or runny nose. She reports a history of using Flonase, but has not used it recently. She also reports a slight feeling of pressure inside her ears. The patient denies any changes in her hearing, except for the occasional sensation of sounds being louder than they should be. She has a minimal intake of caffeine and alcohol, and she does not smoke.        Review of Systems   Constitutional:  Negative for chills, fatigue and fever.  HENT:  Negative for congestion, ear pain, hearing loss, postnasal drip and sore throat.        Tinnitus. Ear pressure.  Respiratory:  Negative for cough, choking and wheezing.   Cardiovascular:  Negative for chest pain and palpitations.  Gastrointestinal:  Negative for abdominal pain.  Musculoskeletal:  Negative for back pain and joint swelling.  Skin:  Negative for rash.  Neurological:  Negative for dizziness, weakness and light-headedness.  Hematological:  Negative for adenopathy. Does not bruise/bleed easily.  Psychiatric/Behavioral:  Negative for behavioral problems and decreased concentration.     Past Medical History:  Diagnosis Date   Allergy    seasonal allergies.   Dysrhythmia    PALPITATIONS YEARS AGO-NO CURRENT PROBLEMS   GERD (gastroesophageal reflux disease)    occasional.   Headache    H/O MIGRAINES   Hypertension    Vaginal dysplasia      Social History   Socioeconomic History   Marital status: Divorced    Spouse name: Not on file   Number of children: Not on file   Years of education: Not on file   Highest education level: Not on file  Occupational History   Not on file  Tobacco Use   Smoking status: Never   Smokeless tobacco: Never  Vaping Use   Vaping status: Never Used  Substance and Sexual Activity   Alcohol use: Yes    Comment: social   Drug use: No   Sexual activity: Yes  Other Topics Concern   Not on file  Social History Narrative   Not on file   Social Drivers of Health   Financial Resource Strain: Not on file  Food Insecurity: Not on file  Transportation Needs: Not on file  Physical Activity: Not on file  Stress: Not on file  Social Connections: Not on file  Intimate Partner Violence: Not on file    Past Surgical History:  Procedure Laterality Date   CERVICAL BIOPSY  W/ LOOP ELECTRODE EXCISION  2003   CO2 LASER APPLICATION N/A 06/05/2019   Procedure: CO2 LASER  APPLICATION OF VAGINA, CERVIX;  Surgeon: Carver Fila, MD;  Location: [facility] [place];  Service: Gynecology;  Laterality: N/A;   DILATATION & CURETTAGE/HYSTEROSCOPY WITH MYOSURE N/A 06/19/2015   Procedure: DILATATION & CURETTAGE/ HYSTEROSCOPY ;  Surgeon: Nadara Mustard, MD;  Location: [facility];  Service: Gynecology;  Laterality: N/A;   LEEP N/A 06/19/2015   Procedure: LOOP ELECTROSURGICAL EXCISION PROCEDURE (LEEP);  Surgeon: Nadara Mustard, MD;  Location: [facility];  Service: Gynecology;  Laterality: N/A;   lensectomy Bilateral 2016   for scar tissue build up    Family History  Problem Relation Age of Onset   Asthma Sister    Food Allergy Sister    Asthma Daughter    Diabetes Mother    Hypertension Mother    Cancer Mother        melonoma of vulva   Cancer Father        melanoma   Breast cancer Cousin    Allergic rhinitis Neg Hx    Angioedema Neg Hx    Eczema Neg Hx    Immunodeficiency Neg Hx    Urticaria Neg Hx    Colon cancer Neg Hx    Esophageal cancer Neg Hx    Stomach cancer Neg Hx    Rectal cancer Neg Hx     No Known Allergies  Current Outpatient Medications on File Prior to Visit  Medication Sig Dispense Refill   amoxicillin (AMOXIL) 400 MG/5ML suspension 10 ml po bid 7 days 140 mL 0   fluticasone (FLONASE) 50 MCG/ACT nasal spray SPRAY 2 SPRAYS INTO EACH NOSTRIL EVERY DAY (Patient taking differently: as needed.) 16 mL 5   omeprazole (PRILOSEC) 20 MG capsule Take 20 mg by mouth daily as needed.      traZODone (DESYREL) 50 MG tablet TAKE 1/2 TO 1 TABLET(25 TO 50 MG) BY MOUTH AT BEDTIME AS NEEDED FOR SLEEP 30 tablet 0   No current facility-administered medications on file prior to visit.    BP (!) 140/87   Pulse 62   Temp 97.7 F (36.5 C)   Resp 18   Ht 5\' 4"  (1.626 m)   Wt 178 lb 9.6 oz (81 kg)   LMP 09/28/2013   SpO2 99%   BMI 30.66 kg/m        Objective:   Physical Exam  General Mental Status- Alert. General Appearance- Not in acute  distress.   Skin General: Color- Normal Color. Moisture- Normal Moisture.  Neck No JVD.  Chest and Lung Exam Auscultation: Breath Sounds:-CTA  Cardiovascular Auscultation:Rythm- RRR Murmurs & Other Heart Sounds:Auscultation of the heart reveals- No Murmurs.  Abdomen Inspection:-Inspeection Normal. Palpation/Percussion:Note:No mass. Palpation and Percussion of the abdomen reveal- Non Tender, Non Distended + BS, no rebound or guarding.   Neurologic Cranial Nerve exam:- CN III-XII intact(No nystagmus), symmetric smile. Drift Test:- No drift. Romberg Exam:- Negative.  Heal to Toe Gait exam:-Normal. Finger to Nose:- Normal/Intact Strength:- 5/5 equal and symetric strength  both upper and lower extremities.       Assessment & Plan:   Tinnitus Daily low-level ringing in both ears for the past 5-6 months. No significant wax impaction noted on exam. No exposure to loud noises. No significant caffeine or alcohol intake. Mild ear pressure reported. -Start Flonase to rule out allergy-related causes. -Check thyroid function if tinnitus persists. -Consider ENT referral if no improvement after basic interventions.  Hypertension Elevated blood pressure today, likely due to missed dose of hydrochlorothiazide yesterday and possible increased salt intake. -Continue hydrochlorothiazide. -Check blood pressure at home over the next three days. -Consider adding a second antihypertensive medication if blood pressure remains elevated.  Gastroesophageal Reflux Disease (GERD) Intermittent heartburn, worsened around the holidays. Not taking omeprazole regularly. -Prescribe omeprazole 20mg  daily. -Advise healthier diet. -Consider adding famotidine if symptoms persist despite omeprazole and dietary changes.  General Health Maintenance -Schedule wellness exam in January or February 2025. -Complete metabolic panel, lipid panel, and complete blood count at that time. -Consider shingles  vaccine.  Esperanza Richters, PA-C

## 2023-01-12 NOTE — Patient Instructions (Addendum)
Tinnitus Daily low-level ringing in both ears for the past 5-6 months. No significant wax impaction noted on exam. No exposure to loud noises. No significant caffeine or alcohol intake. Mild ear pressure reported. -Start Flonase to rule out allergy-related causes. -Check thyroid function if tinnitus persists. -Consider ENT referral if no improvement after basic interventions.  Hypertension Elevated blood pressure today, likely due to missed dose of hydrochlorothiazide yesterday and possible increased salt intake. -Continue hydrochlorothiazide. -Check blood pressure at home over the next three days. -Consider adding a second antihypertensive medication if blood pressure remains elevated.  Gastroesophageal Reflux Disease (GERD) Intermittent heartburn, worsened around the holidays. Not taking omeprazole regularly. -Prescribe omeprazole 20mg  daily. -Advise healthier diet. -Consider adding famotidine if symptoms persist despite omeprazole and dietary changes.  General Health Maintenance -Schedule wellness exam in January or February 2025. -Complete metabolic panel, lipid panel, and complete blood count at that time. -Consider shingles vaccine.  Tinnitus Tinnitus is when you hear a sound that there's no actual source for. It may sound like ringing in your ears or something else. The sound may be loud, soft, or somewhere in between. It can last for a few seconds or be constant for days. It can come and go. Almost everyone has tinnitus at some point. It's not the same as hearing loss. But you may need to see a health care provider if: It lasts for a long time. It comes back often. You have trouble sleeping and focusing. What are the causes? The cause of tinnitus is often unknown. In some cases, you may get it if: You're around loud noises, such as from machines or music. An object gets stuck in your ear. Earwax builds up in Landscape architect. You drink a lot of alcohol or caffeine. You take certain  medicines. You start to lose your hearing. You may also get it from some medical conditions. These may include: Ear or sinus infections. Heart diseases. High blood pressure. Allergies. Mnire's disease. Problems with your thyroid. A tumor. This is a growth of cells that isn't normal. A weak, bulging blood vessel called an aneurysm near your ear. What increases the risk? You may be more likely to get tinnitus if: You're around loud noises a lot. You're older. You drink alcohol. You smoke. What are the signs or symptoms? The main symptom is hearing a sound that there's no source for. It may sound like ringing. It may also sound like: Buzzing. Sizzling. Blowing air. Hissing. Whistling. Other sounds may include: Roaring. Running water. A musical note. Tapping. Humming. You may have symptoms in one ear or both ears. How is this diagnosed? Tinnitus is diagnosed based on your symptoms, your medical history, and an exam. Your provider may do a full hearing test if your tinnitus: Is in just one ear. Makes it hard for you to hear. Lasts 6 months or longer. You may also need to see an expert in hearing disorders called an audiologist. They may ask you about your symptoms and how tinnitus affects your daily life. You may have other tests done. These may include: A CT scan. An MRI. An angiogram. This shows how blood flows through your blood vessels. How is this treated? Treatment may include: Therapy to help you manage the stress of living with tinnitus. Finding ways to mask or cover the sound of tinnitus. These include: Sound or white noise machines. Devices that fit in your ear and play sounds or music. A loud humidifier. Acoustic neural stimulation. This is when you use  headphones to listen to music that has a special signal in it. Over time, this signal may change some of the pathways in your brain. This can make you less sensitive to tinnitus. This treatment is used for very  severe cases. Using hearing aids or cochlear implants if your tinnitus is from hearing loss. If your tinnitus is caused by a medical condition, treating the condition may make it go away.  Follow these instructions at home: Managing symptoms     Try to avoid being in loud places or around loud noises. Wear earplugs or headphones when you're around loud noises. Find ways to reduce stress. These may include meditation, yoga, or deep breathing. Sleep with your head slightly raised. General instructions Take over-the-counter and prescription medicines only as told by your provider. Track the things that cause symptoms (triggers). Try to avoid these things. To stop your tinnitus from getting worse: Do not drink alcohol. Do not have caffeine. Do not use any products that contain nicotine or tobacco. These products include cigarettes, chewing tobacco, and vaping devices, such as e-cigarettes. If you need help quitting, ask your provider. Avoid using too much salt. Get enough sleep each night. Where to find more information American Tinnitus Association: https://www.johnson-hamilton.org/ Contact a health care provider if: Your symptoms last for 3 weeks or longer without stopping. You have sudden hearing loss. Your symptoms get worse or don't get better with home care. You can't manage the stress of living with tinnitus. Get help right away if: You get tinnitus after a head injury. You have tinnitus and: Dizziness. Nausea and vomiting. Loss of balance. A sudden, severe headache. Changes to your eyesight. Weakness in your face, arms, or legs. These symptoms may be an emergency. Get help right away. Call 911. Do not wait to see if the symptoms will go away. Do not drive yourself to the hospital. This information is not intended to replace advice given to you by your health care provider. Make sure you discuss any questions you have with your health care provider. Document Revised: 04/11/2022 Document Reviewed:  04/11/2022 Elsevier Patient Education  2024 [employer].

## 2023-01-26 ENCOUNTER — Ambulatory Visit (HOSPITAL_BASED_OUTPATIENT_CLINIC_OR_DEPARTMENT_OTHER)
Admission: RE | Admit: 2023-01-26 | Discharge: 2023-01-26 | Disposition: A | Discharge status: Home / Self Care | Payer: PRIVATE HEALTH INSURANCE | Source: Ambulatory Visit | Attending: Medical | Admitting: Medical

## 2023-01-26 DIAGNOSIS — Z1231 Encounter for screening mammogram for malignant neoplasm of breast: Secondary | ICD-10-CM | POA: Diagnosis present

## 2023-01-27 ENCOUNTER — Encounter: Payer: Self-pay | Admitting: Medical

## 2023-01-30 ENCOUNTER — Other Ambulatory Visit: Payer: Self-pay | Admitting: Medical

## 2023-01-30 DIAGNOSIS — R928 Other abnormal and inconclusive findings on diagnostic imaging of breast: Secondary | ICD-10-CM

## 2023-01-30 NOTE — Telephone Encounter (Signed)
 No labs or imaging placed?

## 2023-02-06 ENCOUNTER — Ambulatory Visit
Admission: RE | Admit: 2023-02-06 | Discharge: 2023-02-06 | Disposition: A | Discharge status: Home / Self Care | Payer: PRIVATE HEALTH INSURANCE | Source: Ambulatory Visit | Attending: Medical | Admitting: Medical

## 2023-02-06 DIAGNOSIS — R928 Other abnormal and inconclusive findings on diagnostic imaging of breast: Secondary | ICD-10-CM

## 2023-02-15 ENCOUNTER — Other Ambulatory Visit: Payer: Self-pay | Admitting: Medical

## 2023-05-28 ENCOUNTER — Other Ambulatory Visit: Payer: Self-pay | Admitting: Medical

## 2023-08-17 LAB — HM PAP SMEAR: HM Pap smear: NEGATIVE

## 2023-09-19 ENCOUNTER — Ambulatory Visit: Payer: PRIVATE HEALTH INSURANCE | Admitting: Medical

## 2023-09-19 ENCOUNTER — Telehealth: Payer: Self-pay

## 2023-09-19 ENCOUNTER — Encounter: Payer: Self-pay | Admitting: Medical

## 2023-09-19 VITALS — BP 123/70 | HR 58 | Temp 98.0°F | Resp 16 | Ht 64.0 in | Wt 165.4 lb

## 2023-09-19 DIAGNOSIS — R002 Palpitations: Secondary | ICD-10-CM | POA: Diagnosis not present

## 2023-09-19 DIAGNOSIS — R42 Dizziness and giddiness: Secondary | ICD-10-CM

## 2023-09-19 DIAGNOSIS — I10 Essential (primary) hypertension: Secondary | ICD-10-CM | POA: Diagnosis not present

## 2023-09-19 DIAGNOSIS — F419 Anxiety disorder, unspecified: Secondary | ICD-10-CM | POA: Diagnosis not present

## 2023-09-19 MED ORDER — CLONAZEPAM 0.5 MG PO TABS: ORAL_TABLET | ORAL | 0 refills | Status: AC

## 2023-09-19 NOTE — Telephone Encounter (Signed)
Appt today w/ PCP.  

## 2023-09-19 NOTE — Progress Notes (Signed)
 Subjective:    Patient ID: Joanne Ibarra, female    DOB: 1966/01/15, 58 y.o.   MRN: [id number]  HPI  Joanne Ibarra is a 58 year old female who presents with episodes of dizziness and variable blood pressure readings.  She experiences episodes of dizziness, primarily when her blood pressure was  lower end over past 2 days. These episodes are brief, lasting about 10-15 seconds, and occur especially when walking around. She has been monitoring her blood pressure at home using a wrist monitor and has noted fluctuations. This morning, her blood pressure was 151/94 while lying in bed, 157/90 after getting up, and 126/71 at work.  She reports a tingling sensation in her face when her blood pressure is high, along with occasional headaches. No chest pain, sustained chest pressure, or constant palpitations.  She has a history of palpitations, described as a sensation of her heart skipping a beat, sometimes accompanied by a cough. These palpitations were more frequent years ago when she wore a heart monitor  in the past and was told she may have had premature ventricular contractions (PVCs); at that time, she was drinking a lot of caffeine. Recently, she experienced brief palpitations earlier today, prompting her to check her blood pressure.  She mentions feeling anxious over the past few weeks, with a sensation similar to a panic attack occurring on Sunday or Monday.   Her caffeine intake includes a mug of coffee in the morning and occasionally a caffeinated drink during the day, but she primarily drinks water.    Review of Systems  Constitutional:  Negative for chills and fatigue.  Respiratory:  Negative for cough, chest tightness and wheezing.   Cardiovascular:  Positive for palpitations. Negative for chest pain.  Gastrointestinal:  Negative for abdominal pain.  Genitourinary:  Negative for dysuria, flank pain and frequency.  Musculoskeletal:  Negative for back pain.  Skin:  Negative  for rash.  Neurological:  Positive for dizziness. Negative for seizures and weakness.       See hpi  Hematological:  Negative for adenopathy.  Psychiatric/Behavioral:  Negative for sleep disturbance. The patient is nervous/anxious.    Past Medical History:  Diagnosis Date   Allergy    seasonal allergies.   Dysrhythmia    PALPITATIONS YEARS AGO-NO CURRENT PROBLEMS   GERD (gastroesophageal reflux disease)    occasional.   Headache    H/O MIGRAINES   Hypertension    Vaginal dysplasia      Social History   Socioeconomic History   Marital status: Divorced    Spouse name: Not on file   Number of children: Not on file   Years of education: Not on file   Highest education level: Not on file  Occupational History   Not on file  Tobacco Use   Smoking status: Never   Smokeless tobacco: Never  Vaping Use   Vaping status: Never Used  Substance and Sexual Activity   Alcohol use: Yes    Comment: social   Drug use: No   Sexual activity: Yes  Other Topics Concern   Not on file  Social History Narrative   Not on file   Social Drivers of Health   Financial Resource Strain: Not on file  Food Insecurity: Not on file  Transportation Needs: Not on file  Physical Activity: Not on file  Stress: Not on file  Social Connections: Not on file  Intimate Partner Violence: Not on file    Past Surgical History:  Procedure  Laterality Date   CERVICAL BIOPSY  W/ LOOP ELECTRODE EXCISION  2003   CO2 LASER APPLICATION N/A 06/05/2019   Procedure: CO2 LASER APPLICATION OF VAGINA, CERVIX;  Surgeon: Viktoria Comer SAUNDERS, MD;  Location: [facility];  Service: Gynecology;  Laterality: N/A;   DILATATION & CURETTAGE/HYSTEROSCOPY WITH MYOSURE N/A 06/19/2015   Procedure: DILATATION & CURETTAGE/ HYSTEROSCOPY ;  Surgeon: Lamar SHAUNNA Lesches, MD;  Location: [facility];  Service: Gynecology;  Laterality: N/A;   LEEP N/A 06/19/2015   Procedure: LOOP ELECTROSURGICAL EXCISION PROCEDURE (LEEP);  Surgeon:  Lamar SHAUNNA Lesches, MD;  Location: [facility];  Service: Gynecology;  Laterality: N/A;   lensectomy Bilateral 2016   for scar tissue build up    Family History  Problem Relation Age of Onset   Asthma Sister    Food Allergy Sister    Asthma Daughter    Diabetes Mother    Hypertension Mother    Cancer Mother        melonoma of vulva   Cancer Father        melanoma   Breast cancer Cousin    Allergic rhinitis Neg Hx    Angioedema Neg Hx    Eczema Neg Hx    Immunodeficiency Neg Hx    Urticaria Neg Hx    Colon cancer Neg Hx    Esophageal cancer Neg Hx    Stomach cancer Neg Hx    Rectal cancer Neg Hx     No Known Allergies  Current Outpatient Medications on File Prior to Visit  Medication Sig Dispense Refill   hydrochlorothiazide  (HYDRODIURIL ) 25 MG tablet TAKE 1 TABLET(25 MG) BY MOUTH DAILY 90 tablet 3   omeprazole  (PRILOSEC) 20 MG capsule Take 20 mg by mouth daily as needed.      omeprazole  (PRILOSEC) 20 MG capsule Take 1 capsule (20 mg total) by mouth daily. 90 capsule 3   fluticasone  (FLONASE ) 50 MCG/ACT nasal spray SPRAY 2 SPRAYS INTO EACH NOSTRIL EVERY DAY (Patient not taking: Reported on 09/19/2023) 16 mL 5   No current facility-administered medications on file prior to visit.    BP 123/70   Pulse (!) 58   Temp 98 F (36.7 C) (Oral)   Resp 16   Ht 5' 4 (1.626 m)   Wt 165 lb 6.4 oz (75 kg)   LMP 09/28/2013   SpO2 97%   BMI 28.39 kg/m        Objective:   Physical Exam  General Mental Status- Alert. General Appearance- Not in acute distress.   Skin General: Color- Normal Color. Moisture- Normal Moisture.  Neck No JVD.  Chest and Lung Exam Auscultation: Breath Sounds:-Normal.  Cardiovascular Auscultation:Rythm- Regular. Murmurs & Other Heart Sounds:Auscultation of the heart reveals- No Murmurs.  Abdomen Inspection:-Inspeection Normal. Palpation/Percussion:Note:No mass. Palpation and Percussion of the abdomen reveal- Non Tender, Non Distended + BS, no  rebound or guarding.   Neurologic Cranial Nerve exam:- CN III-XII intact(No nystagmus), symmetric smile. Strength:- 5/5 equal and symmetric strength both upper and lower extremities.       Assessment & Plan:   Patient Instructions  Elevated blood pressure readings briefly with associated dizziness.(hx of htn on med and now bp well controlled. Last 2 days labile bp readings.) Palpitations occurred once when bp was normal. BP machine checked against manual reading normal.  Highest reading 157/90 mmHg. Palpitations brief, possibly caffeine related. Wrist monitor readings accurate. Anxiety may contribute. - Order CBC and metabolic panel. - Advise to avoid caffeine, especially during vacation. -  Instruct to monitor blood pressure once daily if asymptomatic, more frequently if symptomatic. - Recommend using a pulse oximeter for palpitations. - Advise emergency care for sustained palpitations, constant chest pressure, or pain. - Schedule follow-up in 10 days.   Anxiety  Increased anxiety may have effected bp readings/increased. Situational anxiety previously treated with clonazepam .  - Prescribe 2 tablets of clonazepam  for potential use during vacation.(avoid UC visit if possible)  Follow up 10 days or sooner if needed   Dallas Maxwell, PA-C   I personally spent a total of 45 minutes in the care of the patient today including getting/reviewing separately obtained history, performing a medically appropriate exam/evaluation, counseling and educating, placing orders, and documenting clinical information in the EHR.   Time did not include EKG time.

## 2023-09-19 NOTE — Telephone Encounter (Signed)
 Initial Comment Caller states her blood pressure is currently at 151/94, and more recently 125/71- she is also experiencing a blood pressure Translation No Nurse Assessment Nurse: [place], RN, Alan Date/Time (Eastern Time): 09/19/2023 9:49:21 AM Confirm and document reason for call. If symptomatic, describe symptoms. ---caller states her bp was 151/94 and going up and down all weekend. on bp meds. no changed or missed doses. Does the patient have any new or worsening symptoms? ---Yes Will a triage be completed? ---Yes Related visit to physician within the last 2 weeks? ---Yes Does the PT have any chronic conditions? (i.e. diabetes, asthma, this includes High risk factors for pregnancy, etc.) ---Yes List chronic conditions. ---HTN Is this a behavioral health or substance abuse call? ---No Guidelines Guideline Title Affirmed Question Affirmed Notes Nurse Date/Time (Eastern Time) Blood Pressure - High [1] Systolic BP >= 130 OR Diastolic >= 80 AND [2] taking BP medications [place]et, RN, Alan 09/19/2023 9:50:24 AM Disp. Time [place] Time) Disposition Final User 09/19/2023 9:51:31 AM See PCP within 24 Hours Yes Hum[place]et, RN, Alan PLEASE NOTE: All timestamps contained within this report are represented as [place] Standard Time. CONFIDENTIALTY NOTICE: This fax transmission is intended only for the addressee. It contains information that is legally privileged, confidential or otherwise protected from use or disclosure. If you are not the intended recipient, you are strictly prohibited from reviewing, disclosing, copying using or disseminating any of this information or taking any action in reliance on or regarding this information. If you have received this fax in error, please notify us  immediately by telephone so that we can arrange for its return to us . Phone: [phone], Toll-Free: [phone], Fax: [phone] [email or web address] 06/30/1965 Page: 1 of2 CallId: [id number] Final  Disposition 09/19/2023 9:51:31 AM See PCP within 24 Hours Yes [place]et, RN, Alan Disposition Overriden: See PCP within 2 Weeks Override Reason: Patient's symptoms need a higher level of care Caller Disagree/Comply Comply Caller Understands Yes PreDisposition Call Doctor Care Advice Given Per Guideline SEE PCP WITHIN 24 HOURS: * IF OFFICE WILL BE OPEN: You need to be examined within the next 24 hours. Call your doctor (or NP/PA) when the office opens and make an appointment. CARE ADVICE given per Blood Pressure - High (Adult) guideline. * You become worse CALL BACK IF: * Chest pain or difficulty breathing occurs * Weakness or numbness of the face, arm or leg on one side of the body occurs * Difficulty walking, difficulty talking, or severe headache occurs Referrals REFERRED TO PCP OFFICE

## 2023-09-19 NOTE — Patient Instructions (Addendum)
 Elevated blood pressure readings briefly with associated dizziness.(hx of htn on med and now bp well controlled. Last 2 days labile bp readings.) Palpitations occurred once when bp was normal. BP machine checked against manual reading normal.  Highest reading 157/90 mmHg. Palpitations brief, possibly caffeine related. Wrist monitor readings accurate. Anxiety may contribute. - Order CBC and metabolic panel. - Advise to avoid caffeine, especially during vacation. - Instruct to monitor blood pressure once daily if asymptomatic, more frequently if symptomatic. - Recommend using a pulse oximeter for palpitations. - Advise emergency care for sustained palpitations, constant chest pressure, or pain. - Schedule follow-up in 10 days.   Anxiety  Increased anxiety may have effected bp readings/increased. Situational anxiety previously treated with clonazepam .  - Prescribe 2 tablets of clonazepam  for potential use during vacation.(avoid UC visit if possible)  Follow up 10 days or sooner if needed

## 2023-09-20 LAB — COMPREHENSIVE METABOLIC PANEL WITH GFR
ALT: 20 U/L (ref 0–35)
AST: 22 U/L (ref 0–37)
Albumin: 4.3 g/dL (ref 3.5–5.2)
Alkaline Phosphatase: 72 U/L (ref 39–117)
BUN: 19 mg/dL (ref 6–23)
CO2: 33 meq/L — ABNORMAL HIGH (ref 19–32)
Calcium: 9.1 mg/dL (ref 8.4–10.5)
Chloride: 99 meq/L (ref 96–112)
Creatinine, Ser: 1.05 mg/dL (ref 0.40–1.20)
GFR: 58.55 mL/min — ABNORMAL LOW (ref >60.00)
Glucose, Bld: 93 mg/dL (ref 70–99)
Potassium: 3.5 meq/L (ref 3.5–5.1)
Sodium: 141 meq/L (ref 135–145)
Total Bilirubin: 0.3 mg/dL (ref 0.2–1.2)
Total Protein: 7.2 g/dL (ref 6.0–8.3)

## 2023-09-20 LAB — CBC WITH DIFFERENTIAL/PLATELET
Basophils Absolute: 0 K/uL (ref 0.0–0.1)
Basophils Relative: 0.8 % (ref 0.0–3.0)
Eosinophils Absolute: 0.1 K/uL (ref 0.0–0.7)
Eosinophils Relative: 1.8 % (ref 0.0–5.0)
HCT: 42.9 % (ref 36.0–46.0)
Hemoglobin: 14.2 g/dL (ref 12.0–15.0)
Lymphocytes Relative: 35.6 % (ref 12.0–46.0)
Lymphs Abs: 1.9 K/uL (ref 0.7–4.0)
MCHC: 33.2 g/dL (ref 30.0–36.0)
MCV: 89.4 fl (ref 78.0–100.0)
Monocytes Absolute: 0.6 K/uL (ref 0.1–1.0)
Monocytes Relative: 10.3 % (ref 3.0–12.0)
Neutro Abs: 2.8 K/uL (ref 1.4–7.7)
Neutrophils Relative %: 51.5 % (ref 43.0–77.0)
Platelets: 305 K/uL (ref 150.0–400.0)
RBC: 4.8 Mil/uL (ref 3.87–5.11)
RDW: 14.1 % (ref 11.5–15.5)
WBC: 5.5 K/uL (ref 4.0–10.5)

## 2023-09-21 ENCOUNTER — Ambulatory Visit: Payer: Self-pay | Admitting: Medical

## 2024-01-08 ENCOUNTER — Telehealth: Payer: PRIVATE HEALTH INSURANCE

## 2024-01-23 ENCOUNTER — Other Ambulatory Visit: Payer: Self-pay | Admitting: Medical

## 2024-02-07 ENCOUNTER — Encounter: Payer: PRIVATE HEALTH INSURANCE | Admitting: Medical

## 2024-02-09 ENCOUNTER — Ambulatory Visit: Payer: PRIVATE HEALTH INSURANCE | Admitting: Medical

## 2024-02-09 ENCOUNTER — Encounter: Payer: Self-pay | Admitting: Medical

## 2024-02-09 VITALS — BP 122/80 | HR 63 | Temp 98.2°F | Resp 15 | Ht 64.0 in | Wt 171.6 lb

## 2024-02-09 DIAGNOSIS — Z1283 Encounter for screening for malignant neoplasm of skin: Secondary | ICD-10-CM

## 2024-02-09 DIAGNOSIS — I10 Essential (primary) hypertension: Secondary | ICD-10-CM

## 2024-02-09 DIAGNOSIS — Z0000 Encounter for general adult medical examination without abnormal findings: Secondary | ICD-10-CM | POA: Diagnosis not present

## 2024-02-09 DIAGNOSIS — Z0184 Encounter for antibody response examination: Secondary | ICD-10-CM

## 2024-02-09 DIAGNOSIS — K219 Gastro-esophageal reflux disease without esophagitis: Secondary | ICD-10-CM | POA: Diagnosis not present

## 2024-02-09 DIAGNOSIS — Z13220 Encounter for screening for lipoid disorders: Secondary | ICD-10-CM

## 2024-02-09 LAB — LIPID PANEL
Cholesterol: 254 mg/dL — ABNORMAL HIGH (ref 28–200)
HDL: 68 mg/dL
LDL Cholesterol: 154 mg/dL — ABNORMAL HIGH (ref 10–99)
NonHDL: 185.7
Total CHOL/HDL Ratio: 4
Triglycerides: 159 mg/dL — ABNORMAL HIGH (ref 10.0–149.0)
VLDL: 31.8 mg/dL (ref 0.0–40.0)

## 2024-02-09 LAB — CBC WITH DIFFERENTIAL/PLATELET
Basophils Absolute: 0.1 K/uL (ref 0.0–0.1)
Basophils Relative: 1.2 % (ref 0.0–3.0)
Eosinophils Absolute: 0.1 K/uL (ref 0.0–0.7)
Eosinophils Relative: 2.3 % (ref 0.0–5.0)
HCT: 44 % (ref 36.0–46.0)
Hemoglobin: 14.8 g/dL (ref 12.0–15.0)
Lymphocytes Relative: 34.8 % (ref 12.0–46.0)
Lymphs Abs: 1.8 K/uL (ref 0.7–4.0)
MCHC: 33.7 g/dL (ref 30.0–36.0)
MCV: 89.9 fl (ref 78.0–100.0)
Monocytes Absolute: 0.6 K/uL (ref 0.1–1.0)
Monocytes Relative: 11 % (ref 3.0–12.0)
Neutro Abs: 2.6 K/uL (ref 1.4–7.7)
Neutrophils Relative %: 50.7 % (ref 43.0–77.0)
Platelets: 294 K/uL (ref 150.0–400.0)
RBC: 4.89 Mil/uL (ref 3.87–5.11)
RDW: 13.7 % (ref 11.5–15.5)
WBC: 5 K/uL (ref 4.0–10.5)

## 2024-02-09 LAB — COMPREHENSIVE METABOLIC PANEL WITH GFR
ALT: 20 U/L (ref 3–35)
AST: 22 U/L (ref 5–37)
Albumin: 4.3 g/dL (ref 3.5–5.2)
Alkaline Phosphatase: 71 U/L (ref 39–117)
BUN: 15 mg/dL (ref 6–23)
CO2: 32 meq/L (ref 19–32)
Calcium: 9.7 mg/dL (ref 8.4–10.5)
Chloride: 100 meq/L (ref 96–112)
Creatinine, Ser: 0.85 mg/dL (ref 0.40–1.20)
GFR: 75.24 mL/min
Glucose, Bld: 83 mg/dL (ref 70–99)
Potassium: 4 meq/L (ref 3.5–5.1)
Sodium: 140 meq/L (ref 135–145)
Total Bilirubin: 0.5 mg/dL (ref 0.2–1.2)
Total Protein: 7.4 g/dL (ref 6.0–8.3)

## 2024-02-09 LAB — HEPATITIS B SURFACE ANTIBODY, QUANTITATIVE: Hep B S AB Quant (Post): <5 m[IU]/mL — ABNORMAL LOW

## 2024-02-09 MED ORDER — OMEPRAZOLE 20 MG PO CPDR: 20.0000 mg | DELAYED_RELEASE_CAPSULE | Freq: Every day | ORAL | 3 refills | Status: AC

## 2024-02-09 MED ORDER — HYDROCHLOROTHIAZIDE 25 MG PO TABS: 25.0000 mg | ORAL_TABLET | Freq: Every day | ORAL | 3 refills | Status: AC

## 2024-02-09 NOTE — Progress Notes (Signed)
 "  Subjective:    Patient ID: Joanne Ibarra, female    DOB: [birth year], 59 y.o.   MRN: [id number]  HPI  Here for wellness exam.   Pt now working in mental health/therapist. Pt has  been exercising little bit. Pt states eating healthy. Non smoker. Rare alcohol use. Drinks decaffeinated coffee.    Declined flu vaccine today and current illness.   Up to date on mammogram, colonoscopy and pap.  Pt decline flu, pcv and shingrix vaccine.(Did offer all 3)   Pt is up to date on pap smear every 6 months. Leep procedure in past. Ask pt to get gyn to send last year.  Up to date on colonoscopy. Up to date on mammogram.     Review of Systems  Constitutional:  Negative for chills and fatigue.  Respiratory:  Negative for cough, chest tightness and wheezing.   Cardiovascular:  Negative for chest pain and palpitations.  Gastrointestinal:  Negative for abdominal pain.  Genitourinary:  Negative for dysuria, flank pain and frequency.  Musculoskeletal:  Negative for back pain.  Skin:  Negative for rash.  Neurological:  Negative for dizziness, seizures and weakness.  Hematological:  Negative for adenopathy.  Psychiatric/Behavioral:  Negative for sleep disturbance. The patient is not nervous/anxious.     Past Medical History:  Diagnosis Date   Allergy    seasonal allergies.   Dysrhythmia    PALPITATIONS YEARS AGO-NO CURRENT PROBLEMS   GERD (gastroesophageal reflux disease)    occasional.   Headache    H/O MIGRAINES   Hypertension    Vaginal dysplasia      Social History   Socioeconomic History   Marital status: Divorced    Spouse name: Not on file   Number of children: Not on file   Years of education: Not on file   Highest education level: Not on file  Occupational History   Not on file  Tobacco Use   Smoking status: Never   Smokeless tobacco: Never  Vaping Use   Vaping status: Never Used  Substance and Sexual Activity   Alcohol use: Yes    Comment: social   Drug  use: No   Sexual activity: Yes  Other Topics Concern   Not on file  Social History Narrative   Not on file   Social Drivers of Health   Tobacco Use: Low Risk (02/09/2024)   Patient History    Smoking Tobacco Use: Never    Smokeless Tobacco Use: Never    Passive Exposure: Not on file  Financial Resource Strain: Not on file  Food Insecurity: Not on file  Transportation Needs: Not on file  Physical Activity: Not on file  Stress: Not on file  Social Connections: Not on file  Intimate Partner Violence: Not on file  Depression (PHQ2-9): Low Risk (02/09/2024)   Depression (PHQ2-9)    PHQ-2 Score: 0  Alcohol Screen: Not on file  Housing: Not on file  Utilities: Not on file  Health Literacy: Not on file    Past Surgical History:  Procedure Laterality Date   CERVICAL BIOPSY  W/ LOOP ELECTRODE EXCISION  2003   CO2 LASER APPLICATION N/A 06/05/2019   Procedure: CO2 LASER APPLICATION OF VAGINA, CERVIX;  Surgeon: Viktoria Comer JONELLE, MD;  Location: [facility] [place];  Service: Gynecology;  Laterality: N/A;   DILATATION & CURETTAGE/HYSTEROSCOPY WITH MYOSURE N/A 06/19/2015   Procedure: DILATATION & CURETTAGE/ HYSTEROSCOPY ;  Surgeon: Lamar SHAUNNA Lesches, MD;  Location: [facility];  Service: Gynecology;  Laterality: N/A;   LEEP N/A 06/19/2015   Procedure: LOOP ELECTROSURGICAL EXCISION PROCEDURE (LEEP);  Surgeon: Lamar SHAUNNA Lesches, MD;  Location: [facility];  Service: Gynecology;  Laterality: N/A;   lensectomy Bilateral 2016   for scar tissue build up    Family History  Problem Relation Age of Onset   Asthma Sister    Food Allergy Sister    Asthma Daughter    Diabetes Mother    Hypertension Mother    Cancer Mother        melonoma of vulva   Cancer Father        melanoma   Breast cancer Cousin    Allergic rhinitis Neg Hx    Angioedema Neg Hx    Eczema Neg Hx    Immunodeficiency Neg Hx    Urticaria Neg Hx    Colon cancer Neg Hx    Esophageal cancer Neg Hx    Stomach cancer Neg Hx     Rectal cancer Neg Hx     Allergies[1]  Medications Ordered Prior to Encounter[2]  BP 122/80   Pulse 63   Temp 98.2 F (36.8 C) (Oral)   Resp 15   Ht 5' 4 (1.626 m)   Wt 171 lb 9.6 oz (77.8 kg)   LMP 09/28/2013   SpO2 98%   BMI 29.46 kg/m        Objective:   Physical Exam  General Mental Status- Alert. General Appearance- Not in acute distress.   Skin On inspection of back no worrisome moles or lesions.  Neck Carotid Arteries- Normal color. Moisture- Normal Moisture. No carotid bruits. No JVD.  Chest and Lung Exam Auscultation: Breath Sounds:-Normal.  Cardiovascular Auscultation:Rythm- Regular. Murmurs & Other Heart Sounds:Auscultation of the heart reveals- No Murmurs.  Abdomen Inspection:-Inspeection Normal. Palpation/Percussion:Note:No mass. Palpation and Percussion of the abdomen reveal- Non Tender, Non Distended + BS, no rebound or guarding.   Neurologic Cranial Nerve exam:- CN III-XII intact(No nystagmus), symmetric smile. Strength:- 5/5 equal and symmetric strength both upper and lower extremities.       Assessment & Plan:   For you wellness exam today I have ordered cbc, cmp , hep b surface antibody and lipid panel.  Vaccines declined.   Up to date on colonoscopy., mammogram and papsmear.  Recommend exercise and healthy diet.  We will let you know lab results as they come in.  Follow up date appointment will be determined after lab review.     Essential hypertension Blood pressure generally well-controlled at home with readings around 120/70 mmHg. Elevated during visit likely due to stress, normalized after relaxation. - Continue hydrochlorothiazide  25 mg daily. - Monitor blood pressure at home.  Gastroesophageal reflux disease Heartburn well-controlled with omeprazole . Adhering to dietary modifications. - Continue omeprazole . - Maintain dietary modifications.  Trevell Pariseau, PA-C     [1] No Known Allergies [2]  Current  Outpatient Medications on File Prior to Visit  Medication Sig Dispense Refill   clonazePAM  (KLONOPIN ) 0.5 MG tablet 1 tab po prn anxiety/panick attack 2 tablet 0   omeprazole  (PRILOSEC) 20 MG capsule Take 20 mg by mouth daily as needed.      fluticasone  (FLONASE ) 50 MCG/ACT nasal spray SPRAY 2 SPRAYS INTO EACH NOSTRIL EVERY DAY (Patient not taking: Reported on 02/09/2024) 16 mL 5   No current facility-administered medications on file prior to visit.   "

## 2024-02-09 NOTE — Patient Instructions (Addendum)
 " For you wellness exam today I have ordered cbc, cmp , hep b surface antibody and lipid panel.  Vaccines declined.   Up to date on colonoscopy., mammogram and papsmear.  Recommend exercise and healthy diet.  We will let you know lab results as they come in.  Follow up date appointment will be determined after lab review.    Referral to derm for skin cancer screening.   Essential hypertension Blood pressure generally well-controlled at home with readings around 120/70 mmHg. Elevated during visit likely due to stress, normalized after relaxation. - Continue hydrochlorothiazide  25 mg daily. - Monitor blood pressure at home.  Gastroesophageal reflux disease Heartburn well-controlled with omeprazole . Adhering to dietary modifications. - Continue omeprazole . - Maintain dietary modifications.  Preventive Care 60-49 Years Old, Female Preventive care refers to lifestyle choices and visits with your health care provider that can promote health and wellness. Preventive care visits are also called wellness exams. What can I expect for my preventive care visit? Counseling Your health care provider may ask you questions about your: Medical history, including: Past medical problems. Family medical history. Pregnancy history. Current health, including: Menstrual cycle. Method of birth control. Emotional well-being. Home life and relationship well-being. Sexual activity and sexual health. Lifestyle, including: Alcohol, nicotine or tobacco, and drug use. Access to firearms. Diet, exercise, and sleep habits. Work and work astronomer. Sunscreen use. Safety issues such as seatbelt and bike helmet use. Physical exam Your health care provider will check your: Height and weight. These may be used to calculate your BMI (body mass index). BMI is a measurement that tells if you are at a healthy weight. Waist circumference. This measures the distance around your waistline. This measurement  also tells if you are at a healthy weight and may help predict your risk of certain diseases, such as type 2 diabetes and high blood pressure. Heart rate and blood pressure. Body temperature. Skin for abnormal spots. What immunizations do I need?  Vaccines are usually given at various ages, according to a schedule. Your health care provider will recommend vaccines for you based on your age, medical history, and lifestyle or other factors, such as travel or where you work. What tests do I need? Screening Your health care provider may recommend screening tests for certain conditions. This may include: Lipid and cholesterol levels. Diabetes screening. This is done by checking your blood sugar (glucose) after you have not eaten for a while (fasting). Pelvic exam and Pap test. Hepatitis B test. Hepatitis C test. HIV (human immunodeficiency virus) test. STI (sexually transmitted infection) testing, if you are at risk. Lung cancer screening. Colorectal cancer screening. Mammogram. Talk with your health care provider about when you should start having regular mammograms. This may depend on whether you have a family history of breast cancer. BRCA-related cancer screening. This may be done if you have a family history of breast, ovarian, tubal, or peritoneal cancers. Bone density scan. This is done to screen for osteoporosis. Talk with your health care provider about your test results, treatment options, and if necessary, the need for more tests. Follow these instructions at home: Eating and drinking  Eat a diet that includes fresh fruits and vegetables, whole grains, lean protein, and low-fat dairy products. Take vitamin and mineral supplements as recommended by your health care provider. Do not drink alcohol if: Your health care provider tells you not to drink. You are pregnant, may be pregnant, or are planning to become pregnant. If you drink alcohol: Limit how much  you have to 0-1 drink a  day. Know how much alcohol is in your drink. In the [place] one drink equals one 12 oz bottle of beer (355 mL), one 5 oz glass of wine (148 mL), or one 1 oz glass of hard liquor (44 mL). Lifestyle Brush your teeth every morning and night with fluoride toothpaste. Floss one time each day. Exercise for at least 30 minutes 5 or more days each week. Do not use any products that contain nicotine or tobacco. These products include cigarettes, chewing tobacco, and vaping devices, such as e-cigarettes. If you need help quitting, ask your health care provider. Do not use drugs. If you are sexually active, practice safe sex. Use a condom or other form of protection to prevent STIs. If you do not wish to become pregnant, use a form of birth control. If you plan to become pregnant, see your health care provider for a prepregnancy visit. Take aspirin only as told by your health care provider. Make sure that you understand how much to take and what form to take. Work with your health care provider to find out whether it is safe and beneficial for you to take aspirin daily. Find healthy ways to manage stress, such as: Meditation, yoga, or listening to music. Journaling. Talking to a trusted person. Spending time with friends and family. Minimize exposure to UV radiation to reduce your risk of skin cancer. Safety Always wear your seat belt while driving or riding in a vehicle. Do not drive: If you have been drinking alcohol. Do not ride with someone who has been drinking. When you are tired or distracted. While texting. If you have been using any mind-altering substances or drugs. Wear a helmet and other protective equipment during sports activities. If you have firearms in your house, make sure you follow all gun safety procedures. Seek help if you have been physically or sexually abused. What's next? Visit your health care provider once a year for an annual wellness visit. Ask your health care provider  how often you should have your eyes and teeth checked. Stay up to date on all vaccines. This information is not intended to replace advice given to you by your health care provider. Make sure you discuss any questions you have with your health care provider. Document Revised: 07/01/2020 Document Reviewed: 07/01/2020 Elsevier Patient Education  2024 [employer]. "

## 2024-02-10 ENCOUNTER — Ambulatory Visit: Payer: Self-pay | Admitting: Medical

## 2024-02-12 ENCOUNTER — Telehealth: Payer: Self-pay | Admitting: Medical

## 2024-02-12 DIAGNOSIS — E785 Hyperlipidemia, unspecified: Secondary | ICD-10-CM

## 2024-02-12 NOTE — Telephone Encounter (Signed)
 Referral to cardiologist placed for high cholesterol.

## 2024-02-13 NOTE — Progress Notes (Signed)
 Last read by Tonia R Viruet at 12:32PM on 02/12/2024.

## 2024-03-12 ENCOUNTER — Ambulatory Visit: Payer: PRIVATE HEALTH INSURANCE

## 2024-11-05 ENCOUNTER — Ambulatory Visit: Payer: PRIVATE HEALTH INSURANCE | Admitting: Physician Assistant
